# Patient Record
Sex: Male | Born: 1960 | Race: White | Hispanic: No | Marital: Married | Smoking: Never smoker
Health system: Southern US, Community
[De-identification: ages and names within clinical notes are randomized; demographics above are authoritative.]

## PROBLEM LIST (undated history)

## (undated) DIAGNOSIS — H15009 Unspecified scleritis, unspecified eye: Secondary | ICD-10-CM

## (undated) DIAGNOSIS — J309 Allergic rhinitis, unspecified: Secondary | ICD-10-CM

## (undated) DIAGNOSIS — I10 Essential (primary) hypertension: Secondary | ICD-10-CM

## (undated) DIAGNOSIS — K219 Gastro-esophageal reflux disease without esophagitis: Secondary | ICD-10-CM

## (undated) DIAGNOSIS — E669 Obesity, unspecified: Secondary | ICD-10-CM

## (undated) DIAGNOSIS — E1165 Type 2 diabetes mellitus with hyperglycemia: Secondary | ICD-10-CM

## (undated) DIAGNOSIS — J329 Chronic sinusitis, unspecified: Secondary | ICD-10-CM

## (undated) HISTORY — DX: Allergic rhinitis, unspecified: J30.9

## (undated) HISTORY — DX: Type 2 diabetes mellitus with hyperglycemia: E11.65

## (undated) HISTORY — DX: Unspecified scleritis, unspecified eye: H15.009

## (undated) HISTORY — DX: Obesity, unspecified: E66.9

## (undated) HISTORY — DX: Essential (primary) hypertension: I10

## (undated) HISTORY — DX: Gastro-esophageal reflux disease without esophagitis: K21.9

## (undated) HISTORY — DX: Chronic sinusitis, unspecified: J32.9

---

## 1990-10-02 HISTORY — PX: OTHER SURGICAL HISTORY: SHX169

## 1999-03-28 ENCOUNTER — Encounter: Payer: Self-pay | Admitting: Gastroenterology

## 1999-04-01 ENCOUNTER — Encounter: Payer: Self-pay | Admitting: Gastroenterology

## 2001-11-02 DIAGNOSIS — H15009 Unspecified scleritis, unspecified eye: Secondary | ICD-10-CM

## 2001-11-02 HISTORY — DX: Unspecified scleritis, unspecified eye: H15.009

## 2005-08-11 ENCOUNTER — Ambulatory Visit: Payer: Self-pay | Admitting: Family Medicine

## 2005-09-01 ENCOUNTER — Ambulatory Visit: Payer: Self-pay | Admitting: Family Medicine

## 2005-09-27 ENCOUNTER — Ambulatory Visit: Payer: Self-pay | Admitting: Family Medicine

## 2005-10-02 HISTORY — PX: OTHER SURGICAL HISTORY: SHX169

## 2005-12-15 ENCOUNTER — Ambulatory Visit: Payer: Self-pay | Admitting: Family Medicine

## 2006-02-12 ENCOUNTER — Ambulatory Visit: Payer: Self-pay | Admitting: Family Medicine

## 2006-02-16 ENCOUNTER — Ambulatory Visit: Payer: Self-pay | Admitting: Family Medicine

## 2006-07-27 ENCOUNTER — Ambulatory Visit: Payer: Self-pay | Admitting: Family Medicine

## 2006-10-12 ENCOUNTER — Ambulatory Visit: Payer: Self-pay | Admitting: Family Medicine

## 2006-12-05 ENCOUNTER — Ambulatory Visit: Payer: Self-pay | Admitting: Family Medicine

## 2007-02-15 ENCOUNTER — Ambulatory Visit: Payer: Self-pay | Admitting: Family Medicine

## 2007-02-15 LAB — CONVERTED CEMR LAB
Albumin: 4 g/dL (ref 3.5–5.2)
Alkaline Phosphatase: 75 units/L (ref 39–117)
BUN: 14 mg/dL (ref 6–23)
Creatinine, Ser: 1.1 mg/dL (ref 0.4–1.5)
Direct LDL: 155.2 mg/dL
GFR calc non Af Amer: 77 mL/min
HDL: 41 mg/dL (ref >39.0)
Potassium: 4.3 meq/L (ref 3.5–5.1)
Sodium: 140 meq/L (ref 135–145)
TSH: 0.81 microintl units/mL (ref 0.35–5.50)
Total Bilirubin: 0.8 mg/dL (ref 0.3–1.2)
Total CHOL/HDL Ratio: 5
VLDL: 14 mg/dL (ref 0–40)

## 2007-02-20 DIAGNOSIS — I10 Essential (primary) hypertension: Secondary | ICD-10-CM

## 2007-02-20 DIAGNOSIS — E785 Hyperlipidemia, unspecified: Secondary | ICD-10-CM

## 2007-02-22 ENCOUNTER — Ambulatory Visit: Payer: Self-pay | Admitting: Family Medicine

## 2007-04-23 ENCOUNTER — Telehealth (INDEPENDENT_AMBULATORY_CARE_PROVIDER_SITE_OTHER): Payer: Self-pay | Admitting: *Deleted

## 2007-04-29 ENCOUNTER — Ambulatory Visit: Payer: Self-pay | Admitting: Family Medicine

## 2007-06-10 ENCOUNTER — Ambulatory Visit: Payer: Self-pay | Admitting: Family Medicine

## 2007-08-02 ENCOUNTER — Telehealth (INDEPENDENT_AMBULATORY_CARE_PROVIDER_SITE_OTHER): Payer: Self-pay | Admitting: *Deleted

## 2007-08-06 ENCOUNTER — Ambulatory Visit: Payer: Self-pay | Admitting: Family Medicine

## 2007-08-06 DIAGNOSIS — I495 Sick sinus syndrome: Secondary | ICD-10-CM

## 2007-10-03 HISTORY — PX: ESOPHAGOGASTRODUODENOSCOPY: SHX1529

## 2007-10-07 ENCOUNTER — Ambulatory Visit: Payer: Self-pay | Admitting: Family Medicine

## 2008-02-17 ENCOUNTER — Telehealth: Payer: Self-pay | Admitting: Family Medicine

## 2008-02-25 ENCOUNTER — Ambulatory Visit: Payer: Self-pay | Admitting: Family Medicine

## 2008-02-25 LAB — CONVERTED CEMR LAB
Albumin: 3.9 g/dL (ref 3.5–5.2)
Alkaline Phosphatase: 64 units/L (ref 39–117)
BUN: 13 mg/dL (ref 6–23)
Calcium: 9.8 mg/dL (ref 8.4–10.5)
Cholesterol: 228 mg/dL (ref 0–200)
Creatinine, Ser: 1 mg/dL (ref 0.4–1.5)
Direct LDL: 165.9 mg/dL
GFR calc Af Amer: 103 mL/min
GFR calc non Af Amer: 86 mL/min
Glucose, Bld: 112 mg/dL — ABNORMAL HIGH (ref 70–99)
HDL: 33 mg/dL — ABNORMAL LOW (ref >39.0)
Potassium: 4.2 meq/L (ref 3.5–5.1)
Total Protein: 6.7 g/dL (ref 6.0–8.3)
Triglycerides: 161 mg/dL — ABNORMAL HIGH (ref 0–149)
VLDL: 32 mg/dL (ref 0–40)

## 2008-02-27 ENCOUNTER — Ambulatory Visit: Payer: Self-pay | Admitting: Family Medicine

## 2008-02-27 DIAGNOSIS — R5383 Other fatigue: Secondary | ICD-10-CM

## 2008-02-27 DIAGNOSIS — IMO0001 Reserved for inherently not codable concepts without codable children: Secondary | ICD-10-CM

## 2008-02-27 DIAGNOSIS — R5381 Other malaise: Secondary | ICD-10-CM

## 2008-02-28 ENCOUNTER — Encounter: Payer: Self-pay | Admitting: Family Medicine

## 2008-03-01 LAB — CONVERTED CEMR LAB
Basophils Absolute: 0 10*3/uL (ref 0.0–0.1)
Basophils Relative: 0.2 % (ref 0.0–1.0)
Eosinophils Absolute: 0.1 10*3/uL (ref 0.0–0.7)
Eosinophils Relative: 1.4 % (ref 0.0–5.0)
Lymphocytes Relative: 42.5 % (ref 12.0–46.0)
MCV: 87.9 fL (ref 78.0–100.0)
Neutrophils Relative %: 46.4 % (ref 43.0–77.0)
PSA: 0.41 ng/mL (ref 0.10–4.00)
Platelets: 261 10*3/uL (ref 150–400)
RBC: 5.04 M/uL (ref 4.22–5.81)
Rhuematoid fact SerPl-aCnc: <20 intl units/mL — ABNORMAL LOW (ref 0.0–20.0)
Testosterone: 564.63 ng/dL (ref 350.00–890)
WBC: 5.2 10*3/uL (ref 4.5–10.5)

## 2008-03-03 ENCOUNTER — Ambulatory Visit: Payer: Self-pay | Admitting: Family Medicine

## 2008-04-10 ENCOUNTER — Ambulatory Visit: Payer: Self-pay | Admitting: Family Medicine

## 2008-04-13 ENCOUNTER — Encounter (INDEPENDENT_AMBULATORY_CARE_PROVIDER_SITE_OTHER): Payer: Self-pay | Admitting: *Deleted

## 2008-05-14 ENCOUNTER — Ambulatory Visit: Payer: Self-pay | Admitting: Family Medicine

## 2008-06-03 ENCOUNTER — Ambulatory Visit: Payer: Self-pay | Admitting: Family Medicine

## 2008-07-31 ENCOUNTER — Telehealth: Payer: Self-pay | Admitting: Family Medicine

## 2008-08-20 ENCOUNTER — Telehealth (INDEPENDENT_AMBULATORY_CARE_PROVIDER_SITE_OTHER): Payer: Self-pay | Admitting: *Deleted

## 2008-08-25 ENCOUNTER — Ambulatory Visit: Payer: Self-pay | Admitting: Gastroenterology

## 2008-08-26 LAB — CONVERTED CEMR LAB
ALT: 40 units/L (ref 0–53)
AST: 31 units/L (ref 0–37)
Albumin: 3.9 g/dL (ref 3.5–5.2)
Basophils Absolute: 0 10*3/uL (ref 0.0–0.1)
CO2: 29 meq/L (ref 19–32)
Calcium: 9.3 mg/dL (ref 8.4–10.5)
Chloride: 105 meq/L (ref 96–112)
Eosinophils Absolute: 0.1 10*3/uL (ref 0.0–0.7)
Lymphocytes Relative: 43.3 % (ref 12.0–46.0)
MCHC: 34.5 g/dL (ref 30.0–36.0)
MCV: 88.4 fL (ref 78.0–100.0)
Neutrophils Relative %: 42.6 % — ABNORMAL LOW (ref 43.0–77.0)
Platelets: 235 10*3/uL (ref 150–400)
Potassium: 3.7 meq/L (ref 3.5–5.1)
Total Protein: 6.9 g/dL (ref 6.0–8.3)
WBC: 5.7 10*3/uL (ref 4.5–10.5)

## 2008-09-14 ENCOUNTER — Ambulatory Visit: Payer: Self-pay | Admitting: Gastroenterology

## 2008-09-14 ENCOUNTER — Encounter: Payer: Self-pay | Admitting: Gastroenterology

## 2008-09-14 DIAGNOSIS — B3781 Candidal esophagitis: Secondary | ICD-10-CM | POA: Insufficient documentation

## 2008-09-17 ENCOUNTER — Encounter: Payer: Self-pay | Admitting: Gastroenterology

## 2008-10-12 ENCOUNTER — Telehealth: Payer: Self-pay | Admitting: Gastroenterology

## 2008-10-14 ENCOUNTER — Ambulatory Visit: Payer: Self-pay | Admitting: Gastroenterology

## 2009-01-25 ENCOUNTER — Ambulatory Visit: Payer: Self-pay | Admitting: Family Medicine

## 2009-03-16 ENCOUNTER — Ambulatory Visit: Payer: Self-pay | Admitting: Family Medicine

## 2009-10-14 ENCOUNTER — Ambulatory Visit: Payer: Self-pay | Admitting: Family Medicine

## 2009-10-14 ENCOUNTER — Encounter: Payer: Self-pay | Admitting: Family Medicine

## 2009-12-14 ENCOUNTER — Ambulatory Visit: Payer: Self-pay | Admitting: Family Medicine

## 2010-02-04 ENCOUNTER — Telehealth: Payer: Self-pay | Admitting: Family Medicine

## 2010-02-05 ENCOUNTER — Ambulatory Visit: Payer: Self-pay | Admitting: Family Medicine

## 2010-03-01 ENCOUNTER — Ambulatory Visit: Payer: Self-pay | Admitting: Family Medicine

## 2010-05-09 ENCOUNTER — Encounter (INDEPENDENT_AMBULATORY_CARE_PROVIDER_SITE_OTHER): Payer: Self-pay | Admitting: *Deleted

## 2010-05-10 ENCOUNTER — Telehealth: Payer: Self-pay | Admitting: Family Medicine

## 2010-05-14 ENCOUNTER — Ambulatory Visit: Payer: Self-pay | Admitting: Family Medicine

## 2010-05-14 DIAGNOSIS — M79609 Pain in unspecified limb: Secondary | ICD-10-CM

## 2010-06-28 ENCOUNTER — Ambulatory Visit: Payer: Self-pay | Admitting: Internal Medicine

## 2010-07-12 ENCOUNTER — Telehealth (INDEPENDENT_AMBULATORY_CARE_PROVIDER_SITE_OTHER): Payer: Self-pay | Admitting: *Deleted

## 2010-07-13 ENCOUNTER — Ambulatory Visit: Payer: Self-pay | Admitting: Family Medicine

## 2010-07-20 ENCOUNTER — Ambulatory Visit: Payer: Self-pay | Admitting: Family Medicine

## 2010-09-08 ENCOUNTER — Ambulatory Visit: Payer: Self-pay | Admitting: Internal Medicine

## 2010-11-01 NOTE — Assessment & Plan Note (Signed)
Summary: sinus infection/ alc   Vital Signs:  Patient profile:   50 year old male Weight:      278.75 pounds Temp:     98.5 degrees F oral Pulse rate:   60 / minute Pulse rhythm:   regular BP sitting:   124 / 86  (left arm) Cuff size:   large  Vitals Entered By: Sydell Axon LPN (December 14, 2009 10:08 AM) CC: ? Sinus infection, headache, sinus drainage, ringing in the ears and follow-up on pain in toes   History of Present Illness: Pt here for followup of toe pain. He noticed in January that he was having areas on his toes that hurt and focally looked to hiim like he had fluid in them. The lesions are painful but mostly flat.  Craqmping has improved. He has no fever or chills, he has headache frontally off and on at no particular time, no ear pain now but had some previously. He has chronch ringing. He flew duroing the time of the sxs starting and then got a cold in [place] with a cold. He has nasal congestion, using Guaifenesin, mild rhinitis but a lot of PND, has ST, and has cough, sometimmes excessive at night and keeping him awake. He has no SOB, no N/V now but has had nausea today and has ongoing difficulties there, no GI help through referral.  Problems Prior to Update: 1)  Leg Cramps  (ICD-729.82) 2)  Overweight  (ICD-278.02) 3)  Candidiasis of The Esophagus  (ICD-112.84) 4)  Nausea  (ICD-787.02) 5)  Muscle Pain  (ICD-729.1) 6)  Fatigue  (ICD-780.79) 7)  Sinus Bradycardia  (ICD-427.81) 8)  Health Maintenance Exam  (ICD-V70.0) 9)  Gerd, Nsaid Induced, No Nsaids  (ICD-530.81) 10)  Hypercholesterolemia, Pure  (ICD-272.0) 11)  Hypertension, Benign Essential  (ICD-401.1)  Medications Prior to Update: 1)  Lisinopril 20 Mg  Tabs (Lisinopril) .... One Tab By Mouth At Night 2)  Astelin 137 Mcg/spray Soln (Azelastine Hcl) .... As Directed 3)  Nasonex 50 Mcg/act Susp (Mometasone Furoate) .... As Directed As Needed 4)  Zyrtec Allergy 10 Mg Tabs (Cetirizine Hcl) .Marland Kitchen.. 1 Daily As Needed For  Allergies 5)  Prevacid 30 Mg Cpdr (Lansoprazole) .... Take 1 Tablet By Mouth Once A Day  Allergies: 1)  ! Nsaids 2)  Zithromax (Azithromycin) 3)  Ketek (Telithromycin)  Physical Exam  General:  Well-developed,well-nourished,in no acute distress; alert,appropriate and cooperative throughout examination, mildly obese, minimally congested and nontoxic.Marland Kitchen Head:  Normocephalic and atraumatic without obvious abnormalities. No apparent alopecia, mild balding.balding. Suinuses minimally tender in frontal distrib. Eyes:  Conjunctiva clear bilaterally.  Ears:  External ear exam shows no significant lesions or deformities.  Otoscopic examination reveals clear canals, tympanic membranes are intact bilaterally without bulging, retraction, inflammation or discharge. Hearing is grossly normal bilaterally. Nose:  External nasal examination shows no deformity or inflammation. Nasal mucosa are pink and moidst, mildly inflamed R>L. Mouth:  Oral mucosa and oropharynx without lesions or exudates.  Teeth in good repair. No mucocele noted, tonsils with crypts w/o debris. Mild thick PND. Neck:  No deformities, masses, or tenderness noted. Lungs:  Normal respiratory effort, chest expands symmetrically. Lungs are clear to auscultation, no crackles or wheezes. Heart:  Normal rate and regular rhythm. S1 and S2 normal without gallop, murmur, click, rub or other extra sounds. Abdomen:  Bowel sounds positive,abdomen soft and non-tender without masses, organomegaly or hernias noted.   Impression & Recommendations:  Problem # 1:  DERMATITIS OF TOES FROM DRYNESS AND  ABRASION (ICD-692.9) Assessment New See instructions. His updated medication list for this problem includes:    Zyrtec Allergy 10 Mg Tabs (Cetirizine hcl) .Marland Kitchen... 1 daily as needed for allergies  Problem # 2:  URI (ICD-465.9) Assessment: New See instrucrttions. Start Amox Sat if no better. His updated medication list for this problem includes:    Zyrtec  Allergy 10 Mg Tabs (Cetirizine hcl) .Marland Kitchen... 1 daily as needed for allergies    Tussionex Pennkinetic Er 8-10 Mg/51ml Lqcr (Chlorpheniramine-hydrocodone) ..... One tsp by mouth at night.  Problem # 3:  LEG CRAMPS (ICD-729.82) Assessment: Improved Discussed mustard if sxs recurred.  Complete Medication List: 1)  Lisinopril 20 Mg Tabs (Lisinopril) .... One tab by mouth at night 2)  Astelin 137 Mcg/spray Soln (Azelastine hcl) .... Use one spray in each nostril every am 3)  Nasonex 50 Mcg/act Susp (Mometasone furoate) .... Use one spray in each nostril every am 4)  Zyrtec Allergy 10 Mg Tabs (Cetirizine hcl) .Marland Kitchen.. 1 daily as needed for allergies 5)  Prevacid 30 Mg Cpdr (Lansoprazole) .... Take 1 tablet by mouth once a day 6)  Tussionex Pennkinetic Er 8-10 Mg/41ml Lqcr (Chlorpheniramine-hydrocodone) .... One tsp by mouth at night. 7)  Amoxicillin 500 Mg Caps (Amoxicillin) .... 2 tabs by mouth two times a day  Patient Instructions: 1)  Wear shoes and use Eucerin on feet two times a day.  2)  Use Tussionex at night. 3)  Take Guaifenesin by [facility] or [facility] and getting MUCOUS RELIEF EXPECTORANT (400mg ), take 11/2 tabs by mouth AM and NOON. 4)  Drink lots of fluids anytime taking Guaifenesin.  5)  Take Tyl ES 2 tabs by mouth three times a day. 6)  Keep lozenge in mouth all the time. 7)  Gargle with warm salt water every 1/2 hr for 2-3 days. 8)  Neti Pot as dir. Prescriptions: AMOXICILLIN 500 MG CAPS (AMOXICILLIN) 2 tabs by mouth two times a day  #40 x 0   Entered and Authorized by:   Shaune Leeks MD   Signed by:   Shaune Leeks MD on 12/14/2009   Method used:   Print then Give to Patient   RxID:   [id number] TUSSIONEX PENNKINETIC ER 8-10 MG/5ML LQCR (CHLORPHENIRAMINE-HYDROCODONE) one tsp by mouth at night.  #8 oz x 0   Entered and Authorized by:   Shaune Leeks MD   Signed by:   Shaune Leeks MD on 12/14/2009   Method used:   Print then  Give to Patient   RxID:   [id number]   Current Allergies (reviewed today): ! NSAIDS ZITHROMAX (AZITHROMYCIN) KETEK (TELITHROMYCIN)  Prevention & Chronic Care Immunizations   Influenza vaccine: Not documented    Tetanus booster: 10/03/2002: Td    Pneumococcal vaccine: Not documented  Other Screening   Smoking status: never  (02/20/2007)  Lipids   Total Cholesterol: 228  (02/25/2008)   LDL: DEL  (02/25/2008)   LDL Direct: 165.9  (02/25/2008)   HDL: 33.0  (02/25/2008)   Triglycerides: 161  (02/25/2008)    SGOT (AST): 31  (08/25/2008)   SGPT (ALT): 40  (08/25/2008)   Alkaline phosphatase: 81  (08/25/2008)   Total bilirubin: 0.4  (08/25/2008)  Hypertension   Last Blood Pressure: 124 / 86  (12/14/2009)   Serum creatinine: 1.0  (08/25/2008)   Serum potassium 3.7  (08/25/2008)  Self-Management Support :    Hypertension self-management support: Not documented    Lipid self-management support: Not documented

## 2010-11-01 NOTE — Letter (Signed)
Summary: Nadara Eaton letter  [place] [facility]  [address] [place], [place] [address]   Phone: [phone]  Fax: [phone]       05/09/2010 MRN: [id number]  [facility] [address] [place], [place]  [address]  Dear Mr. Danelle Earth[facility] [place], and [place] announce the retirement of Arta Silence, M.D., from full-time practice at the [facility] office effective March 31, 2010 and his plans of returning part-time.  It is important to Dr. Hetty Ely and to our practice that you understand that [facility] has seven physicians in our office for your health care needs.  We will continue to offer the same exceptional care that you have today.    Dr. Hetty Ely has spoken to many of you about his plans for retirement and returning part-time in the fall.   We will continue to work with you through the transition to schedule appointments for you in the office and meet the high standards that [place] is committed to.   Again, it is with great pleasure that we share the news that Dr. Hetty Ely will return to [facility] in October of 2011 with a reduced schedule.    If you have any questions, or would like to request an appointment with one of our physicians, please call us at [phone] and press the option for Scheduling an appointment.  We take pleasure in providing you with excellent patient care and look forward to seeing you at your next office visit.  Our [facility] Physicians are:  Tillman Abide, M.D. Laurita Quint, M.D. Roxy Manns, M.D. Kerby Nora, M.D. Hannah Beat, M.D. Ruthe Mannan, M.D. We proudly welcomed Raechel Ache, M.D. and Eustaquio Boyden, M.D. to the practice in July/August 2011.  Sincerely,  [facility]

## 2010-11-01 NOTE — Assessment & Plan Note (Signed)
Summary: ?SINUS INFECTION/CLE   Vital Signs:  Patient profile:   50 year old male Weight:      278.50 pounds Temp:     98.4 degrees F oral Pulse rate:   64 / minute Pulse rhythm:   regular BP sitting:   130 / 80  (left arm) Cuff size:   large  Vitals Entered By: Selena Batten Dance CMA Duncan Dull) (June 28, 2010 8:09 AM) CC: ? sinus infection   History of Present Illness: CC: sinus infx?  1+wk h/o tenderness in teeth, headache.  Started guaifenesin two times a day. Over weekend had bad drainage down throat.  Mainly left frontal and max sinus pain as well as ear pain and tooth pain.  No fevers/chills.  + coughing, mild.    + daughter sick at home.  No smokers at home.  Pt with h/o sinus infections in past, had orbital cellulitis s/p OR drainage x 2.  Normally takes astelin as well as nasonex and zyrtec.  Drinking plenty of fluids.  Takes amox and augmentin for sinus infections.  Current Medications (verified): 1)  Amlodipine Besylate 5 Mg Tabs (Amlodipine Besylate) .... One Tab By Mouth At Night 2)  Lisinopril 20 Mg  Tabs (Lisinopril) .... One Tab By Mouth At Night 3)  Astelin 137 Mcg/spray Soln (Azelastine Hcl) .... Use One Spray in Each Nostril Every Am 4)  Nasonex 50 Mcg/act Susp (Mometasone Furoate) .... Use One Spray in Each Nostril Every Am 5)  Zyrtec Allergy 10 Mg Tabs (Cetirizine Hcl) .Marland Kitchen.. 1 Daily As Needed For Allergies 6)  Prilosec 20 Mg Cpdr (Omeprazole) .Marland Kitchen.. 1 Every Am  Allergies: 1)  ! Nsaids 2)  Zithromax (Azithromycin) 3)  Ketek (Telithromycin)  Past History:  Past Medical History: GERD Hypertension obesity h/o sinus infections acute on chronic, h/o orbital cellulitis complicating sinusitis in past  Past Surgical History: Reviewed history from 09/18/2008 and no changes required. 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 Claris Gower) R/O''d presume  secondary nsaids 12/04/05  EGD Mod Gastritis Esophagitis Candida by Cult (Dr Christella Hartigan) 09/14/08  Social History: Reviewed history from 08/25/2008 and no changes required. Marital Status: Married Children: One daughter Occupation: Production manager for [employer] nonsmoker, nondrinker, drinks one caffeinated beverage a day currently  Review of Systems       per HPI  Physical Exam  General:  overweight but generally well appearing  Head:  normocephalic, atraumatic, and no abnormalities observed.  L maxillary >frontal sinus tenderness Eyes:  vision grossly intact, pupils equal, pupils round, and pupils reactive to light.  no conjunctival pallor, injection or icterus, EOMI without pain Ears:  R ear normal and L ear normal.   Nose:  nares are injected and congested bilaterally  Mouth:  pharynx pink and moist, no erythema, and no exudates.   Neck:  supple with full rom and no masses or thyromegally, no JVD or carotid bruit  Lungs:  Normal respiratory effort, chest expands symmetrically. Lungs are clear to auscultation, no crackles or wheezes. Heart:  Normal rate and regular rhythm. S1 and S2 normal without gallop, murmur, click, rub or other extra sounds. Pulses:  2+ rad pulses Extremities:  no edema   Impression & Recommendations:  Problem # 1:  SINUSITIS - ACUTE-NOS (ICD-461.9) Continue current sinus/allergy treatment.  add neti pot/nasal saline and 2wk course abx.  Instructed on treatment. Advised to call if not improving as expected or still feeling  poorly after 2 wk course abx, may need extension given h/o chronic sinusitis.  If not improving, consider referral to ENT.  Call if symptoms persist or worsen.  Treating aggressively with abx early given h/o orbital celluitis in past as sinusitis complication  The following medications were removed from the medication list:    Augmentin 875-125 Mg Tabs (Amoxicillin-pot clavulanate) .Marland Kitchen... 1 by mouth two times a day for 10  days His updated medication list for this problem includes:    Astelin 137 Mcg/spray Soln (Azelastine hcl) ..... Use one spray in each nostril every am    Nasonex 50 Mcg/act Susp (Mometasone furoate) ..... Use one spray in each nostril every am    Augmentin 875-125 Mg Tabs (Amoxicillin-pot clavulanate) ..... One by mouth two times a day x 14 days  Complete Medication List: 1)  Amlodipine Besylate 5 Mg Tabs (Amlodipine besylate) .... One tab by mouth at night 2)  Lisinopril 20 Mg Tabs (Lisinopril) .... One tab by mouth at night 3)  Astelin 137 Mcg/spray Soln (Azelastine hcl) .... Use one spray in each nostril every am 4)  Nasonex 50 Mcg/act Susp (Mometasone furoate) .... Use one spray in each nostril every am 5)  Zyrtec Allergy 10 Mg Tabs (Cetirizine hcl) .Marland Kitchen.. 1 daily as needed for allergies 6)  Prilosec 20 Mg Cpdr (Omeprazole) .Marland Kitchen.. 1 every am 7)  Augmentin 875-125 Mg Tabs (Amoxicillin-pot clavulanate) .... One by mouth two times a day x 14 days  Patient Instructions: 1)  Possible early sinusitis again.  Treat with 2 wk course antibiotics. 2)  Continue current sinus treatment, consider starting neti pot of nasal saline spray. 3)  Continue plenty of fluids and rest to help you get over this quicker. 4)  Pleasure to see you today, call clinic iwth questions. 5)  Let us know if not improving as expected. Prescriptions: AUGMENTIN 875-125 MG TABS (AMOXICILLIN-POT CLAVULANATE) one by mouth two times a day x 14 days  #28 x 0   Entered and Authorized by:   Eustaquio Boyden  MD   Signed by:   Eustaquio Boyden  MD on 06/28/2010   Method used:   [facility] to        [facility] [phone]* (retail)       [address] [place], [place]  [address]       Ph: [phone] or [phone]       Fax: [phone]   RxID:   [id number]   Current Allergies (reviewed today): ! NSAIDS ZITHROMAX (AZITHROMYCIN) KETEK (TELITHROMYCIN)

## 2010-11-01 NOTE — Assessment & Plan Note (Signed)
Summary: PAIN IN RIGHT LEG/RBH   Vital Signs:  Patient profile:   50 year old male Height:      70 inches Weight:      274.25 pounds BMI:     39.49 Temp:     97.8 degrees F oral Pulse rate:   92 / minute Pulse rhythm:   regular BP sitting:   156 / 88  (left arm) Cuff size:   large  Vitals Entered By: Delilah Shan CMA Duncan Dull) (October 14, 2009 3:39 PM) CC: Pain in right leg   History of Present Illness: 50 yo with pain in right leg. Started yesterday, full leg cramp, felt it was worse around his knee. Has now resolved but he was very concerend because it woke him from sleep and lasted for hours. Nothing made it better or worse. No redness or swelling around his knee. Never had anything like this before.  HTN- BP elevated today.  Of note, his mother died a few weeks ago, under a lot of stress. No CP, SOB, or blurred vision.  Current Medications (verified): 1)  Lisinopril 20 Mg  Tabs (Lisinopril) .... One Tab By Mouth At Night 2)  Astelin 137 Mcg/spray Soln (Azelastine Hcl) .... As Directed 3)  Nasonex 50 Mcg/act Susp (Mometasone Furoate) .... As Directed As Needed 4)  Zyrtec Allergy 10 Mg Tabs (Cetirizine Hcl) .Marland Kitchen.. 1 Daily As Needed For Allergies 5)  Prevacid 30 Mg Cpdr (Lansoprazole) .... Take 1 Tablet By Mouth Once A Day  Allergies: 1)  ! Nsaids 2)  Zithromax (Azithromycin) 3)  Ketek (Telithromycin)  Review of Systems      See HPI MS:  Complains of cramps; denies joint pain, joint redness, joint swelling, and muscle weakness.  Physical Exam  General:  Well-developed,well-nourished,in no acute distress; alert,appropriate and cooperative throughout examination, mildly obese. Msk:  Right leg- normal inspection, normal palpation, no joint tenderness, no joint swelling, no joint warmth, no redness over joints, and no crepitation.  no joint tenderness, no joint swelling, no joint warmth, no redness over joints, and no crepitation.   Psych:  Cognition and judgment appear  intact. Alert and cooperative with normal attention span and concentration. No apparent delusions, illusions, hallucinations   Impression & Recommendations:  Problem # 1:  LEG CRAMPS (ICD-729.82) Assessment New Resolved.  Etiology unknown but likely multifactorial.  Under a lot of stress.  Will check electrolytes, CK, TSH, CBC to rule out reversible causes. Orders: Venipuncture (45409) Specimen Handling (81191) TLB-BMP (Basic Metabolic Panel-BMET) (80048-METABOL) TLB-CBC Platelet - w/Differential (85025-CBCD) TLB-TSH (Thyroid Stimulating Hormone) (84443-TSH) TLB-CK Total Only(Creatine Kinase/CPK) (82550-CK)  Problem # 2:  HYPERTENSION, BENIGN ESSENTIAL (ICD-401.1) Assessment: Deteriorated  Likely due to life stressors.  He has BP cuff at home, advised to keep record and bring to next appt. His updated medication list for this problem includes:    Lisinopril 20 Mg Tabs (Lisinopril) ..... One tab by mouth at night  His updated medication list for this problem includes:    Lisinopril 20 Mg Tabs (Lisinopril) ..... One tab by mouth at night  Complete Medication List: 1)  Lisinopril 20 Mg Tabs (Lisinopril) .... One tab by mouth at night 2)  Astelin 137 Mcg/spray Soln (Azelastine hcl) .... As directed 3)  Nasonex 50 Mcg/act Susp (Mometasone furoate) .... As directed as needed 4)  Zyrtec Allergy 10 Mg Tabs (Cetirizine hcl) .Marland Kitchen.. 1 daily as needed for allergies 5)  Prevacid 30 Mg Cpdr (Lansoprazole) .... Take 1 tablet by mouth once a day Prescriptions:  ASTELIN 137 MCG/SPRAY SOLN (AZELASTINE HCL) as directed  #1 x 6   Entered by:   Delilah Shan CMA (AAMA)   Authorized by:   Ruthe Mannan MD   Signed by:   Delilah Shan CMA (AAMA) on 10/15/2009   Method used:   [facility] to        [facility] [phone]* (retail)       [address] [place], [place]  [address]       Ph: [phone] or [phone]       Fax: [phone]   RxID:   [id number] NASONEX 50  MCG/ACT SUSP (MOMETASONE FUROATE) as directed as needed  #17 Gram x 11   Entered by:   Delilah Shan CMA (AAMA)   Authorized by:   Ruthe Mannan MD   Signed by:   Delilah Shan CMA (AAMA) on 10/14/2009   Method used:   Electronically to        [facility] [phone]* (retail)       [address] [place], [place]  [address]       Ph: [phone] or [phone]       Fax: [phone]   RxID:   [id number] ASTELIN 137 MCG/SPRAY SOLN (AZELASTINE HCL) as directed  #0 x 6   Entered by:   Delilah Shan CMA (AAMA)   Authorized by:   Ruthe Mannan MD   Signed by:   Delilah Shan CMA (AAMA) on 10/14/2009   Method used:   [facility]ron[facility] to        [facility] [phone]* (retail)       [address] [place], [place]  [address]       Ph: [phone] or [phone]       Fax: [phone]   RxID:   [id number]    Orders Added: 1)  Venipuncture [25053] 2)  Specimen Handling [99000] 3)  TLB-BMP (Basic Metabolic Panel-BMET) [80048-METABOL] 4)  TLB-CBC Platelet - w/Differential [85025-CBCD] 5)  TLB-TSH (Thyroid Stimulating Hormone) [84443-TSH] 6)  TLB-CK Total Only(Creatine Kinase/CPK) [82550-CK] 7)  Est. Patient Level III [[address]]   Current Allergies (reviewed today): ! NSAIDS ZITHROMAX (AZITHROMYCIN) KETEK (TELITHROMYCIN)

## 2010-11-01 NOTE — Assessment & Plan Note (Signed)
Summary: follow up on meds   Vital Signs:  Patient profile:   50 year old male Weight:      279.75 pounds Temp:     98.6 degrees F oral Pulse rate:   60 / minute Pulse rhythm:   regular BP sitting:   120 / 76  (left arm) Cuff size:   large  Vitals Entered By: Sydell Axon LPN (Mar 01, 2010 8:55 AM) CC: follow-up on meds and BP   History of Present Illness: Pt here with wife. He has gained weight since last time and is now on Amlodipine for BP which he is tolerating well. Has mionimal dizziness. No real complaints today. He is still doing most if not all of the cooking.  Allergies: 1)  ! Nsaids 2)  Zithromax (Azithromycin) 3)  Ketek (Telithromycin)  Physical Exam  General:  Well-developed,well-nourished,in no acute distress; alert,appropriate and cooperative throughout examination,  obese. Head:  Normocephalic and atraumatic without obvious abnormalities. No apparent alopecia, mild balding.balding. Suinuses NT. Eyes:  Conjunctiva clear bilaterally.  Ears:  External ear exam shows no significant lesions or deformities.  Otoscopic examination reveals clear canals, tympanic membranes are intact bilaterally without bulging, retraction, inflammation or discharge. Hearing is grossly normal bilaterally. Nose:  External nasal examination shows no deformity or inflammation. Nasal mucosa are pink and moist. Mouth:  Oral mucosa and oropharynx without lesions or exudates.  Teeth in good repair. No mucocele noted, tonsils with crypts w/o debris.  Neck:  No deformities, masses, or tenderness noted. Chest Wall:  No deformities, masses, tenderness or gynecomastia noted. Lungs:  Normal respiratory effort, chest expands symmetrically. Lungs are clear to auscultation, no crackles or wheezes. Heart:  Normal rate and regular rhythm. S1 and S2 normal without gallop, murmur, click, rub or other extra sounds.   Impression & Recommendations:  Problem # 1:  HYPERTENSION, BENIGN ESSENTIAL  (ICD-401.1) Assessment Improved Nos better with BP but weight worse...Marland Kitchenneeds to lose and get ojn good regimen for eating, not reward! His updated medication list for this problem includes:    Lisinopril 20 Mg Tabs (Lisinopril) ..... One tab by mouth at night    Amlodipine Besylate 5 Mg Tabs (Amlodipine besylate) ..... One tab by mouth at night  BP today: 120/76 Prior BP: 148/90 (02/05/2010)  Labs Reviewed: K+: 3.7 (08/25/2008) Creat: : 1.0 (08/25/2008)   Chol: 228 (02/25/2008)   HDL: 33.0 (02/25/2008)   LDL: DEL (02/25/2008)   TG: 161 (02/25/2008)  Problem # 2:  OVERWEIGHT (ICD-278.02) Assessment: Deteriorated Needs to eat helthy and healthily!  Discussed. Ht: 70 (02/05/2010)   Wt: 279.75 (03/01/2010)   BMI: 39.72 (02/05/2010)  Complete Medication List: 1)  Lisinopril 20 Mg Tabs (Lisinopril) .... One tab by mouth at night 2)  Astelin 137 Mcg/spray Soln (Azelastine hcl) .... Use one spray in each nostril every am 3)  Nasonex 50 Mcg/act Susp (Mometasone furoate) .... Use one spray in each nostril every am 4)  Zyrtec Allergy 10 Mg Tabs (Cetirizine hcl) .Marland Kitchen.. 1 daily as needed for allergies 5)  Prevacid 30 Mg Cpdr (Lansoprazole) .... Take 1 tablet by mouth once a day 6)  Amlodipine Besylate 5 Mg Tabs (Amlodipine besylate) .... One tab by mouth at night  Patient Instructions: 1)  RTC Oct/Nov for Comp Exam, labs prior. Call in  3 weeks.  Current Allergies (reviewed today): ! NSAIDS ZITHROMAX (AZITHROMYCIN) KETEK (TELITHROMYCIN)

## 2010-11-01 NOTE — Assessment & Plan Note (Signed)
Summary: HIGH BLOOD PRESSURE...AS.   Vital Signs:  Patient profile:   50 year old male Height:      70 inches Weight:      275.8 pounds BMI:     39.72 Pulse rate:   68 / minute BP sitting:   148 / 90  Vitals Entered By: Shary Decamp (Feb 05, 2010 11:09 AM) CC: elevated bp.... c/o of lightheaded & dizzy, bp @ home 180/100, 172/92, 150's--170's/90-100   History of Present Illness: Pt here with his daughter for headaches and dizziness with elevated BPs at the same time. He has no other problems and otherwise is doingwell but has some stress at home as his wife just had surgery and he is caring for her as well.   Problems Prior to Update: 1)  Dermatitis of Toes From Dryness and Abrasion  (ICD-692.9) 2)  Uri  (ICD-465.9) 3)  Leg Cramps  (ICD-729.82) 4)  Overweight  (ICD-278.02) 5)  Candidiasis of The Esophagus  (ICD-112.84) 6)  Nausea  (ICD-787.02) 7)  Muscle Pain  (ICD-729.1) 8)  Fatigue  (ICD-780.79) 9)  Sinus Bradycardia  (ICD-427.81) 10)  Health Maintenance Exam  (ICD-V70.0) 11)  Gerd, Nsaid Induced, No Nsaids  (ICD-530.81) 12)  Hypercholesterolemia, Pure  (ICD-272.0) 13)  Hypertension, Benign Essential  (ICD-401.1)  Medications Prior to Update: 1)  Lisinopril 20 Mg  Tabs (Lisinopril) .... One Tab By Mouth At Night 2)  Astelin 137 Mcg/spray Soln (Azelastine Hcl) .... Use One Spray in Each Nostril Every Am 3)  Nasonex 50 Mcg/act Susp (Mometasone Furoate) .... Use One Spray in Each Nostril Every Am 4)  Zyrtec Allergy 10 Mg Tabs (Cetirizine Hcl) .Marland Kitchen.. 1 Daily As Needed For Allergies 5)  Prevacid 30 Mg Cpdr (Lansoprazole) .... Take 1 Tablet By Mouth Once A Day 6)  Tussionex Pennkinetic Er 8-10 Mg/35ml Lqcr (Chlorpheniramine-Hydrocodone) .... One Tsp By Mouth At Night. 7)  Amoxicillin 500 Mg Caps (Amoxicillin) .... 2 Tabs By Mouth Two Times A Day  Current Medications (verified): 1)  Lisinopril 20 Mg  Tabs (Lisinopril) .... One Tab By Mouth At Night 2)  Astelin 137 Mcg/spray Soln  (Azelastine Hcl) .... Use One Spray in Each Nostril Every Am 3)  Nasonex 50 Mcg/act Susp (Mometasone Furoate) .... Use One Spray in Each Nostril Every Am 4)  Zyrtec Allergy 10 Mg Tabs (Cetirizine Hcl) .Marland Kitchen.. 1 Daily As Needed For Allergies 5)  Prevacid 30 Mg Cpdr (Lansoprazole) .... Take 1 Tablet By Mouth Once A Day  Allergies (verified): 1)  ! Nsaids 2)  Zithromax (Azithromycin) 3)  Ketek (Telithromycin)  Physical Exam  General:  Well-developed,well-nourished,in no acute distress; alert,appropriate and cooperative throughout examination, mildly obese, minimally congested and nontoxic.Marland Kitchen Head:  Normocephalic and atraumatic without obvious abnormalities. No apparent alopecia, mild balding.balding. Suinuses minimally tender in frontal distrib. Eyes:  Conjunctiva clear bilaterally.  Ears:  External ear exam shows no significant lesions or deformities.  Otoscopic examination reveals clear canals, tympanic membranes are intact bilaterally without bulging, retraction, inflammation or discharge. Hearing is grossly normal bilaterally. Nose:  External nasal examination shows no deformity or inflammation. Nasal mucosa are pink and moidst, mildly inflamed R>L. Mouth:  Oral mucosa and oropharynx without lesions or exudates.  Teeth in good repair. No mucocele noted, tonsils with crypts w/o debris. Mild thick PND. Neck:  No deformities, masses, or tenderness noted. Lungs:  Normal respiratory effort, chest expands symmetrically. Lungs are clear to auscultation, no crackles or wheezes. Heart:  Normal rate and regular rhythm. S1 and S2  normal without gallop, murmur, click, rub or other extra sounds. Abdomen:  Bowel sounds positive,abdomen soft and non-tender without masses, organomegaly or hernias noted. Psych:  Cognition and judgment appear intact. Alert and cooperative with normal attention span and concentration. No apparent delusions, illusions, hallucinations   Impression & Recommendations:  Problem # 1:   HYPERTENSION, BENIGN ESSENTIAL (ICD-401.1) Assessment Deteriorated Add Amlodipine. Pt has been on Verapamil previously but I switched that as it was decreasing his HR too much.  Will try 5mg  daily. His updated medication list for this problem includes:    Lisinopril 20 Mg Tabs (Lisinopril) ..... One tab by mouth at night    Amlodipine Besylate 5 Mg Tabs (Amlodipine besylate) ..... One tab by mouth at night  BP today: 148/90 Prior BP: 124/86 (12/14/2009)  Labs Reviewed: K+: 3.7 (08/25/2008) Creat: : 1.0 (08/25/2008)   Chol: 228 (02/25/2008)   HDL: 33.0 (02/25/2008)   LDL: DEL (02/25/2008)   TG: 161 (02/25/2008)  Complete Medication List: 1)  Lisinopril 20 Mg Tabs (Lisinopril) .... One tab by mouth at night 2)  Astelin 137 Mcg/spray Soln (Azelastine hcl) .... Use one spray in each nostril every am 3)  Nasonex 50 Mcg/act Susp (Mometasone furoate) .... Use one spray in each nostril every am 4)  Zyrtec Allergy 10 Mg Tabs (Cetirizine hcl) .Marland Kitchen.. 1 daily as needed for allergies 5)  Prevacid 30 Mg Cpdr (Lansoprazole) .... Take 1 tablet by mouth once a day 6)  Amlodipine Besylate 5 Mg Tabs (Amlodipine besylate) .... One tab by mouth at night  Patient Instructions: 1)  Cnx this weeks appt and make one for 3 weeks. Prescriptions: AMLODIPINE BESYLATE 5 MG TABS (AMLODIPINE BESYLATE) one tab by mouth at night  #30 x 12   Entered and Authorized by:   Shaune Leeks MD   Signed by:   Shaune Leeks MD on 02/05/2010   Method used:   Electronically to        [facility] [phone]* (retail)       [address] [place], [place]  [address]       Ph: [phone] or [phone]       Fax: [phone]   RxID:   [id number]

## 2010-11-01 NOTE — Progress Notes (Signed)
----   Converted from flag ---- ---- 07/11/2010 5:30 PM, Crawford Givens MD wrote: cmet/lipid 401.1  ---- 07/11/2010 4:29 PM, Melody Comas wrote: Patient is coming in for cpx labs for Dr. Hetty Ely tomorrow. Dr. Hetty Ely is not in tomorrow. What labs should be drawn and diagnosis please. ------------------------------

## 2010-11-01 NOTE — Progress Notes (Signed)
Summary: sinus sxs  Phone Note Call from Patient   Caller: Patient Summary of Call: Pt called complaining of sinus sxs, asked to be seen.  Offered 2 appt times this afternoon or appt tomorrow.  These times were not convenient for the pt, I suggested he go to [facility]. Initial call taken by: Lowella Petties CMA,  May 10, 2010 8:55 AM

## 2010-11-01 NOTE — Progress Notes (Signed)
Summary: BP elevated  Phone Note Call from Patient   Caller: Patient Summary of Call: Pt reports that his blood pressure has been running high,  180/100 this morning.  Advised pt he needs appt.  Appt scheduled for 5/10.  He will keep a check on BP this week end and will call on monday if it doesnt go down. Initial call taken by: Lowella Petties CMA,  Feb 04, 2010 12:29 PM  Follow-up for Phone Call        Noted and agree. Follow-up by: Shaune Leeks MD,  Feb 04, 2010 3:10 PM

## 2010-11-01 NOTE — Assessment & Plan Note (Signed)
Summary: CPX/DLO   Vital Signs:  Patient profile:   50 year old male Weight:      275 pounds Temp:     98.2 degrees F oral Pulse rate:   64 / minute Pulse rhythm:   regular BP sitting:   120 / 80  (left arm) Cuff size:   large  Vitals Entered By: Sydell Axon LPN (July 20, 2010 9:23 AM) CC: 30 minute checkup   History of Present Illness: Pt here for Comp Exam. He had had sinus infection from seasonal Fall allergy congestion which has now finally gone away. He feels well today without  he has started basic regular exercise lately with somre diet changes lately. This has been a rough year with stress at work. The first version is out and vacation is coming up.   Preventive Screening-Counseling & Management  Alcohol-Tobacco     Alcohol drinks/day: <1     Alcohol type: rare beer     Smoking Status: never     Passive Smoke Exposure: no  Caffeine-Diet-Exercise     Caffeine use/day: 0     Does Patient Exercise: yes     Type of exercise: aerobic exerc and cycles     Times/week: <3  Problems Prior to Update: 1)  Leg Pain, Bilateral  (ICD-729.5) 2)  Sinusitis - Acute-nos  (ICD-461.9) 3)  Dermatitis of Toes From Dryness and Abrasion  (ICD-692.9) 4)  Leg Cramps  (ICD-729.82) 5)  Overweight  (ICD-278.02) 6)  Candidiasis of The Esophagus  (ICD-112.84) 7)  Nausea  (ICD-787.02) 8)  Muscle Pain  (ICD-729.1) 9)  Fatigue  (ICD-780.79) 10)  Sinus Bradycardia  (ICD-427.81) 11)  Health Maintenance Exam  (ICD-V70.0) 12)  Gerd, Nsaid Induced, No Nsaids  (ICD-530.81) 13)  Hypercholesterolemia, Pure  (ICD-272.0) 14)  Hypertension, Benign Essential  (ICD-401.1)  Medications Prior to Update: 1)  Amlodipine Besylate 5 Mg Tabs (Amlodipine Besylate) .... One Tab By Mouth At Night 2)  Lisinopril 20 Mg  Tabs (Lisinopril) .... One Tab By Mouth At Night 3)  Astelin 137 Mcg/spray Soln (Azelastine Hcl) .... Use One Spray in Each Nostril Every Am 4)  Nasonex 50 Mcg/act Susp (Mometasone Furoate)  .... Use One Spray in Each Nostril Every Am 5)  Zyrtec Allergy 10 Mg Tabs (Cetirizine Hcl) .Marland Kitchen.. 1 Daily As Needed For Allergies 6)  Prilosec 20 Mg Cpdr (Omeprazole) .Marland Kitchen.. 1 Every Am 7)  Augmentin 875-125 Mg Tabs (Amoxicillin-Pot Clavulanate) .... One By Mouth Two Times A Day X 14 Days  Allergies: 1)  ! 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 Eyes:  Denies  blurring, discharge, and eye pain. ENT:  Complains of ringing in ears; denies decreased hearing and ear discharge; chronic. CV:  Denies chest pain or discomfort, fainting, fatigue, palpitations, shortness of breath with exertion, swelling of feet, and swelling of hands. Resp:  Complains of chest discomfort; denies cough, shortness of breath, and wheezing; some cough at night with allergies, meds readjusted.Marland Kitchen GI:  Denies abdominal pain, bloody stools, change in bowel habits, constipation, dark tarry stools, diarrhea, indigestion, loss of appetite, nausea, vomiting, vomiting blood, and yellowish skin color. GU:  Complains of nocturia; denies discharge, dysuria, and urinary frequency; oncde. MS:  Complains of joint pain and muscle aches; denies low back pain, cramps, and stiffness; knees. Derm:  Denies dryness, itching, and rash. Neuro:  Denies numbness, poor balance, tingling, and tremors.  Physical Exam  General:  Overweight but generally well appearing, in NAD. Head:  Normocephalic and atraumatic without obvious abnormalities. No apparent alopecia or balding. Eyes:  Conjunctiva clear bilaterally.  Ears:  External ear exam shows no significant lesions or deformities.  Otoscopic examination reveals clear canals, tympanic membranes are intact bilaterally without bulging, retraction, inflammation or discharge. Hearing is grossly normal bilaterally. Nose:  External nasal examination shows no deformity or inflammation. Nasal mucosa are pink and moist without lesions or exudates. Mouth:  Oral mucosa and oropharynx without lesions or exudates.  Teeth in good repair. Neck:  supple with full rom and no masses or thyromegally, no JVD or carotid bruit  Chest Wall:  No deformities, masses, tenderness or gynecomastia noted. Breasts:  No masses or gynecomastia noted Lungs:  Normal respiratory effort, chest expands symmetrically. Lungs are clear to auscultation, no crackles or wheezes. Heart:  Normal rate and  regular rhythm. S1 and S2 normal without gallop, murmur, click, rub or other extra sounds. Abdomen:  Bowel sounds positive,abdomen soft and non-tender without masses, organomegaly or hernias noted. Protuberant. Rectal:  No external abnormalities noted. Normal sphincter tone. No rectal masses or tenderness. G neg. Genitalia:  Testes bilaterally descended without nodularity, tenderness or masses. No scrotal masses or lesions. No penis lesions or urethral discharge. Prostate:  Prostate gland firm and smooth, no enlargement, nodularity, tenderness, mass, asymmetry or induration. 10-20 gms  Msk:  No deformity or scoliosis noted of thoracic or lumbar spine.   Pulses:  R and L carotid,radial,femoral,dorsalis pedis and posterior tibial pulses are full and equal bilaterally Extremities:  No clubbing, cyanosis, edema, or deformity noted with normal full range of motion of all joints.   Neurologic:  No cranial nerve deficits noted. Station and gait are normal. Sensory, motor and coordinative functions appear intact. Skin:  Intact without suspicious lesions or rashes Cervical Nodes:  No lymphadenopathy noted Inguinal Nodes:  No significant adenopathy Psych:  normal affect, talkative and pleasant    Impression & Recommendations:  Problem # 1:  HEALTH MAINTENANCE EXAM (ICD-V70.0)  Problem # 2:  DERMATITIS OF TOES FROM DRYNESS AND ABRASION (ICD-692.9) Assessment: Improved  Doing well. Uses emmolient regularly with good results. His updated medication list for this problem includes:    Zyrtec Allergy 10 Mg Tabs (Cetirizine hcl) .Marland KitchenMarland KitchenMarland KitchenMarland Kitchen  1 daily as needed for allergies  Problem # 3:  OVERWEIGHT (ICD-278.02) Assessment: Unchanged Discussed approach to diet and slow progressive exercise. [place] lots of walking. Discussed being careful with diet intake on the trip.  Problem # 4:  MUSCLE PAIN (ICD-729.1) Assessment: Unchanged Try augmenting with Co Q 10.  Problem # 5:   HYPERCHOLESTEROLEMIA, PURE (ICD-272.0) Assessment: Unchanged All ok except LDL slightly increased. Try to watch intake of fatty food...is close enough he can probably influence acceptably. Labs Reviewed: SGOT: 31 (08/25/2008)   SGPT: 40 (08/25/2008)   HDL:33.0 (02/25/2008), 41.0 (02/15/2007)  LDL:DEL (02/25/2008), DEL (02/15/2007)  Chol:228 (02/25/2008), 205 (02/15/2007)  Trig:161 (02/25/2008), 72 (02/15/2007)  Problem # 6:  HYPERTENSION, BENIGN ESSENTIAL (ICD-401.1) Assessment: Unchanged Adequate control. Cont curr meds. His updated medication list for this problem includes:    Amlodipine Besylate 5 Mg Tabs (Amlodipine besylate) ..... One tab by mouth at night    Lisinopril 20 Mg Tabs (Lisinopril) ..... One tab by mouth at night  BP today: 120/80 Prior BP: 130/80 (06/28/2010)  Labs Reviewed: K+: 3.7 (08/25/2008) Creat: : 1.0 (08/25/2008)   Chol: 228 (02/25/2008)   HDL: 33.0 (02/25/2008)   LDL: DEL (02/25/2008)   TG: 161 (02/25/2008)  Complete Medication List: 1)  Amlodipine Besylate 5 Mg Tabs (Amlodipine besylate) .... One tab by mouth at night 2)  Lisinopril 20 Mg Tabs (Lisinopril) .... One tab by mouth at night 3)  Astelin 137 Mcg/spray Soln (Azelastine hcl) .... Use one spray in each nostril every am 4)  Nasonex 50 Mcg/act Susp (Mometasone furoate) .... Use one spray in each nostril every am 5)  Zyrtec Allergy 10 Mg Tabs (Cetirizine hcl) .Marland Kitchen.. 1 daily as needed for allergies 6)  Prilosec 20 Mg Cpdr (Omeprazole) .Marland Kitchen.. 1 every am  Patient Instructions: 1)  RTC one year or as needed.   Orders Added: 1)  Est. Patient 40-64 years [99396]    Current Allergies (reviewed today): ! NSAIDS ZITHROMAX (AZITHROMYCIN) KETEK (TELITHROMYCIN)

## 2010-11-01 NOTE — Assessment & Plan Note (Signed)
Summary: SINUS INF//VGJ   Vital Signs:  Patient profile:   50 year old male Weight:      275 pounds BMI:     39.60 Temp:     98.2 degrees F Pulse rate:   63 / minute BP sitting:   152 / 86  (left arm)  Vitals Entered By: Lamar Sprinkles, CMA (May 14, 2010 10:07 AM) CC: ? sinus infection, c/o sinus drainage, congestion & ear complaints. Mucus is discolored. Symptoms x 2 wks.    History of Present Illness: thinks he has a sinus infection  tends to have recurrent/ chronic sinus infections  one turned into orbital cellulitis in the past -- and so it makes him nervous   uses his usual meds  also guifenesin with water two times a day   started getting worse last week -- low grade fever for a few days  pain over both eyes with pressure  mostly post nasal drip - constant drainage - upsets stomach  ears are stuffy and full nose is congested  ringing in his ears   some sore throat and hoarseness as well   calf muscles have been killing him  had a bad cramp L leg -- am after going back to the gym no chol med      Current Medications (verified): 1)  Lisinopril 20 Mg  Tabs (Lisinopril) .... One Tab By Mouth At Night 2)  Astelin 137 Mcg/spray Soln (Azelastine Hcl) .... Use One Spray in Each Nostril Every Am 3)  Nasonex 50 Mcg/act Susp (Mometasone Furoate) .... Use One Spray in Each Nostril Every Am 4)  Zyrtec Allergy 10 Mg Tabs (Cetirizine Hcl) .Marland Kitchen.. 1 Daily As Needed For Allergies 5)  Prilosec 20 Mg Cpdr (Omeprazole) .Marland Kitchen.. 1 Every Am 6)  Amlodipine Besylate 5 Mg Tabs (Amlodipine Besylate) .... One Tab By Mouth At Night  Allergies (verified): 1)  ! 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 Eyes:  Denies blurring, discharge, and eye irritation. CV:  Denies chest pain or discomfort, shortness of breath with exertion, and swelling of feet. Resp:  Complains of cough; denies shortness of breath and wheezing. GI:  Denies abdominal pain, change in bowel habits, indigestion, nausea, and vomiting. GU:  Denies dysuria. MS:  Complains of muscle aches, cramps, and stiffness; denies muscle weakness. Derm:  Denies itching, lesion(s), poor wound healing, and rash. Neuro:  Complains of headaches; denies numbness and tingling. Endo:  Denies cold intolerance and heat intolerance. Heme:  Denies abnormal bruising and bleeding.  Physical  Exam  General:  overweight but generally well appearing  Head:  normocephalic, atraumatic, and no abnormalities observed.  bilat ethmoid and maxillary sinus tenderness Eyes:  vision grossly intact, pupils equal, pupils round, and pupils reactive to light.  no conjunctival pallor, injection or icterus  Ears:  R ear normal and L ear normal.   Nose:  nares are injected and congested  bilaterally  Mouth:  pharynx pink and moist, no erythema, and no exudates.   Neck:  supple with full rom and no masses or thyromegally, no JVD or carotid bruit  Lungs:  Normal respiratory effort, chest expands symmetrically. Lungs are clear to auscultation, no crackles or wheezes. Heart:  Normal rate and regular rhythm. S1 and S2 normal without gallop, murmur, click, rub or other extra sounds. Msk:  bilat calves are very slt tender to deep palp  no palp cords/ swelling/ redness or lesions some pain on full stretch / dorsiflexion nl rom nl gait  Pulses:  R and L carotid,radial,femoral,dorsalis pedis and posterior tibial pulses are full and equal bilaterally Extremities:  No clubbing, cyanosis, edema, or deformity noted with normal full range of motion of all joints.   Neurologic:  sensation intact to light touch, gait normal, and DTRs symmetrical and normal.   Skin:  Intact without suspicious lesions or rashes Cervical Nodes:  No lymphadenopathy noted Psych:  normal affect, talkative and pleasant    Impression & Recommendations:  Problem # 1:  SINUSITIS - ACUTE-NOS (ICD-461.9) Assessment New acute (on chronic) sinusitis  tx with augmentin and sympt care pt advised to update me if symptoms worsen or do not improve  His updated medication list for this problem includes:    Astelin 137 Mcg/spray Soln (Azelastine hcl) ..... Use one spray in each nostril every am    Nasonex 50 Mcg/act Susp (Mometasone furoate) ..... Use one spray in each nostril every am    Augmentin 875-125 Mg Tabs (Amoxicillin-pot clavulanate) .Marland Kitchen... 1 by mouth two times a day for 10 days  Problem # 2:  LEG PAIN, BILATERAL (ICD-729.5) Assessment: New in calves after re starting an exercise program -- and having some nocturnal cramps  is gradually getting better  disc 3 diff calf stretches and use of heat  update if not imp   Complete Medication List: 1)  Lisinopril 20 Mg Tabs (Lisinopril) .... One tab by mouth at  night 2)  Astelin 137 Mcg/spray Soln (Azelastine hcl) .... Use one spray in each nostril every am 3)  Nasonex 50 Mcg/act Susp (Mometasone furoate) .... Use one spray in each nostril every am 4)  Zyrtec Allergy 10 Mg Tabs (Cetirizine hcl) .Marland Kitchen.. 1 daily as needed for allergies 5)  Prilosec 20 Mg Cpdr (Omeprazole) .Marland Kitchen.. 1 every am 6)  Amlodipine Besylate 5 Mg Tabs (Amlodipine besylate) .... One tab by mouth at night 7)  Augmentin 875-125 Mg Tabs (Amoxicillin-pot clavulanate) .Marland Kitchen.. 1 by mouth two times a day for 10 days  Patient Instructions: 1)  take the augmentin as directed for sinus infection- I sent to your pharmacy 2)  continue current sinus medicine  3)  if not improved - within 7-10 days - please let me know  Prescriptions: AUGMENTIN 875-125 MG TABS (AMOXICILLIN-POT CLAVULANATE) 1 by mouth two times a day for 10 days  #20 x 0   Entered and Authorized by:   Judith Part MD   Signed by:   Judith Part MD on 05/14/2010   Method used:   Electronically to  [facility] [phone]* (retail)       [address] [place], [place]  [address]       Ph: [phone] or [phone]       Fax: [phone]   RxID:   [id number]

## 2010-11-01 NOTE — Assessment & Plan Note (Signed)
Summary: CONGESTION,DRAINAGE/CLE   Vital Signs:  Patient profile:   50 year old male Weight:      277.25 pounds Temp:     98.6 degrees F oral Pulse rate:   68 / minute Pulse rhythm:   regular BP sitting:   140 / 80  (left arm) Cuff size:   large  Vitals Entered By: Selena Batten Dance CMA Duncan Dull) (September 08, 2010 8:57 AM) CC: Congestion/drainage   History of Present Illness: CC: congestion/drainage  2+ wk h/o sinus congestion, cough, purulent drainage out of nose and cough.  + tooth pain and referred ear pain, max sinus pressure.  Taking guaifenesin which has helped, but now over last 4 days noticing L eye hurting as well as "slimey feeling".  Has neti pot but hasn't used, nor nasal saline.    2 fillings day before thanksgiving. 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  New job puts him in [place] several days a week.  Requests emergency abx prescription to fill in case feels sinus infection coming on, says would schedule f/u appt if ever filled.  Current Medications (verified): 1)  Amlodipine Besylate 5 Mg Tabs (Amlodipine Besylate) .... One Tab By Mouth At Night 2)  Lisinopril 20 Mg  Tabs (Lisinopril) .... One Tab By Mouth At Night 3)  Astelin 137 Mcg/spray Soln (Azelastine Hcl) .... Use One Spray in Each Nostril Every Am 4)  Nasonex 50 Mcg/act Susp (Mometasone Furoate) .... Use One Spray in Each Nostril Every Am 5)  Zyrtec Allergy 10 Mg Tabs (Cetirizine Hcl) .Marland Kitchen.. 1 Daily As Needed For Allergies 6)  Prilosec 20 Mg Cpdr (Omeprazole) .Marland Kitchen.. 1 Every Am  Allergies: 1)  ! 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 Head:  Normocephalic and atraumatic without obvious abnormalities. L maxillary sinus tenderness Eyes:  Conjunctiva clear bilaterally. PERRLA, EOMI without pain Ears:  TMs clear bilaterally, congestion L>R Nose:  External nasal examination shows no deformity or inflammation. Nasal mucosa are pink and moist without lesions or exudates. Mouth:  Oral mucosa and oropharynx without lesions or exudates.  Teeth in good repair. Neck:  no LAD Lungs:  Normal respiratory effort, chest expands symmetrically. Lungs are clear to auscultation, no crackles or wheezes. Heart:  Normal rate and regular rhythm. S1 and S2 normal without gallop, murmur, click, rub or other extra sounds. Pulses:  2+ rad pulses Extremities:  no c/c/e Skin:  Intact without suspicious lesions or rashes   Impression & Recommendations:  Problem # 1:  SINUSITIS, MAXILLARY, ACUTE (ICD-461.0) start amoxicillin x 10 days (pt states works better for him).  continue guaifenesin, neti pot/nasal saline, INS.  red flags to return discussed.  provided with emergency abx script given hx complicated sinusitis, and work requires him to go to [place] several days/wk.  to come in for visit if filled.  His updated medication list for this problem includes:    Astelin 137 Mcg/spray Soln (Azelastine hcl) ..... Use one spray in each nostril every am  Nasonex 50 Mcg/act Susp (Mometasone furoate) ..... Use one spray in each nostril every am    Amoxicillin 875 Mg Tabs (Amoxicillin) ..... One twice daily x 10 days    Cheratussin Ac 100-10 Mg/71ml Syrp (Guaifenesin-codeine) ..... One teaspoon nightly as needed cough  Complete Medication List: 1)  Amlodipine Besylate 5 Mg Tabs (Amlodipine besylate) .... One tab by mouth at night 2)   Lisinopril 20 Mg Tabs (Lisinopril) .... One tab by mouth at night 3)  Astelin 137 Mcg/spray Soln (Azelastine hcl) .... Use one spray in each nostril every am 4)  Nasonex 50 Mcg/act Susp (Mometasone furoate) .... Use one spray in each nostril every am 5)  Zyrtec Allergy 10 Mg Tabs (Cetirizine hcl) .Marland Kitchen.. 1 daily as needed for allergies 6)  Prilosec 20 Mg Cpdr (Omeprazole) .Marland Kitchen.. 1 every am 7)  Amoxicillin 875 Mg Tabs (Amoxicillin) .... One twice daily x 10 days 8)  Cheratussin Ac 100-10 Mg/76ml Syrp (Guaifenesin-codeine) .... One teaspoon nightly as needed cough  Patient Instructions: 1)  You have a sinus infection.  Treat with 10d course antibiotics. 2)  Another 10 d course prescription provided today (emergency script), if filled come see [place].  3)  Continue current sinus treatment, consider starting neti pot or nasal saline spray. 4)  Continue plenty of fluids and rest to help you get over this quicker. 5)  Cheratussin for cough at night. 6)  Pleasure to see you today, call clinic iwth questions. 7)  Let us know if not improving as expected. Prescriptions: AMOXICILLIN 875 MG TABS (AMOXICILLIN) one twice daily x 10 days  #20 x 0   Entered and Authorized by:   Eustaquio Boyden  MD   Signed by:   Eustaquio Boyden  MD on 09/08/2010   Method used:   Print then Give to Patient   RxID:   [id number] CHERATUSSIN AC 100-10 MG/5ML SYRP (GUAIFENESIN-CODEINE) one teaspoon nightly as needed cough  #100cc x 0   Entered and Authorized by:   Eustaquio Boyden  MD   Signed by:   Eustaquio Boyden  MD on 09/08/2010   Method used:   Print then Give to Patient   RxID:   [id number] AMOXICILLIN 875 MG TABS (AMOXICILLIN) one twice daily x 10 days  #20 x 0   Entered and Authorized by:   Eustaquio Boyden  MD   Signed by:   Eustaquio Boyden  MD on 09/08/2010   Method used:   [facility] to        [facility] [phone]* (retail)       [address] [place], [place]   [address]       Ph: [phone] or [phone]       Fax: [phone]   RxID:   [id number]    Orders Added: 1)  Est. Patient Level III [60630]    Current Allergies (reviewed today): ! NSAIDS ZITHROMAX (AZITHROMYCIN) KETEK (TELITHROMYCIN)

## 2010-12-09 ENCOUNTER — Ambulatory Visit (INDEPENDENT_AMBULATORY_CARE_PROVIDER_SITE_OTHER): Payer: BC Managed Care – PPO | Admitting: Family Medicine

## 2010-12-09 ENCOUNTER — Telehealth: Payer: Self-pay | Admitting: Family Medicine

## 2010-12-09 ENCOUNTER — Encounter: Payer: Self-pay | Admitting: Family Medicine

## 2010-12-09 DIAGNOSIS — J329 Chronic sinusitis, unspecified: Secondary | ICD-10-CM

## 2010-12-13 NOTE — Assessment & Plan Note (Signed)
Summary: ?SINUS INFECTION/CLE   [employer]   Vital Signs:  Patient profile:   50 year old male Weight:      272 pounds Temp:     98.1 degrees F oral Pulse rate:   84 / minute Pulse rhythm:   regular BP sitting:   118 / 80  (left arm) Cuff size:   large  Vitals Entered By: Selena Batten Dance CMA Duncan Dull) (December 09, 2010 11:36 AM) CC: ?Sinus infection   History of Present Illness: CC: ? 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 Usually sees Dr. Adele Schilder.  has been fighting with allergy sxs for last few weeks - months.  taking guaifenesin as well as astelin, neti pot, nasonex and zyrtec.  Clear mucous out of nose.  mild cough.  yesterday started having noticeable pain L side of face, over eye and brow and temple.  Started taking abx yesterday (given emergency abx to fill if felt bad last visit).  felt a bit feverish (very low grade) last night, tylenol helped with pain and temp.  "eye hurts really bad like in vise".  eye feels "slimy".  currently on day 2 of amoxicillin.  No vision changes.  No pain with movements of eye.  no swelling, no discharge from eye.  no matting in morning.  intolerant of NSAIDs 2/2 gi issues.   Current Medications (verified): 1)  Amlodipine Besylate 5 Mg Tabs (Amlodipine Besylate) .... One Tab By Mouth At Night 2)  Lisinopril 20 Mg  Tabs (Lisinopril) .... One Tab By Mouth At Night 3)  Astelin 137 Mcg/spray Soln (Azelastine Hcl) .... Use One Spray in Each Nostril Every Am 4)  Nasonex 50 Mcg/act Susp (Mometasone Furoate) .... Use One Spray in Each Nostril Every Am 5)  Zyrtec Allergy 10 Mg Tabs (Cetirizine Hcl) .Marland Kitchen.. 1 Daily As Needed For Allergies 6)  Prilosec 20 Mg Cpdr (Omeprazole) .Marland Kitchen.. 1 Every Am  Allergies: 1)  ! 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 Claris Gower) R/O''d presume secondary nsaids 12/04/05  EGD Mod Gastritis Esophagitis Candida by [facility] (Dr Christella Hartigan) 09/14/08  Review of Systems       per hpi  Physical Exam  General:  Overweight but generally well appearing, in NAD. Head:  Normocephalic and atraumatic without obvious abnormalities. L frontal sinus tenderness Eyes:  PERRLA, EOMI without pain, no proptosis, no orbital swelling R eye slightly more pronounced injection than left side, no discharge. Ears:  TMs clear bilaterally, congestion L>R Nose:  External nasal examination shows no deformity or inflammation. Nasal mucosa are pink and moist without lesions or exudates. Mouth:  Oral mucosa and oropharynx without lesions or exudates.  Teeth in good repair. Neck:  no LAD Lungs:  Normal respiratory effort, chest expands symmetrically. Lungs are clear to auscultation, no crackles or wheezes. Heart:  Normal rate and regular rhythm. S1 and S2 normal without gallop, murmur, click, rub or other extra sounds. 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  continue supportie care as up to now.  provided with another emergency script for amox.  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  given some injection and c/o eye pain, recommended he see ophtho today for evaluation of rpt scleritis.  states he will call to be  seen this afternoon.  advised to call us if trouble getting in and I can call ophtho.  The following medications were removed from the medication list:    Amoxicillin 875 Mg Tabs (Amoxicillin) ..... One twice daily x 10 days    Cheratussin Ac 100-10 Mg/55ml Syrp (Guaifenesin-codeine) ..... One teaspoon nightly as needed cough    Amoxicillin 875 Mg Tabs (Amoxicillin) .Marland Kitchen... Take one twice daily for 10 days His updated medication list for this problem includes:    Astelin 137 Mcg/spray Soln (Azelastine hcl) ..... Use one spray in each nostril every am    Nasonex 50 Mcg/act Susp (Mometasone furoate) ..... Use one spray in each nostril every am    Augmentin 875-125 Mg Tabs (Amoxicillin-pot clavulanate) .Marland Kitchen... Take one twice daily for 10 days  Complete Medication List: 1)  Amlodipine Besylate 5 Mg Tabs (Amlodipine besylate) .... One tab by mouth at night 2)  Lisinopril 20 Mg Tabs (Lisinopril) .... One tab by mouth at night 3)  Astelin 137 Mcg/spray Soln (Azelastine hcl) .... Use one spray in each nostril every am 4)  Nasonex 50 Mcg/act Susp (Mometasone furoate) .... Use one spray in each nostril every am 5)  Zyrtec Allergy 10 Mg Tabs (Cetirizine hcl) .Marland Kitchen.. 1 daily as needed for allergies 6)  Prilosec 20 Mg Cpdr (Omeprazole) .Marland Kitchen.. 1 every am 7)  Augmentin 875-125 Mg Tabs (Amoxicillin-pot clavulanate) .... Take one twice daily for 10 days  Patient Instructions: 1)  broaden antibiotics to augmentin twice daily for 10 days total. 2)  continue current treatment regimen as up to now. 3)  Call to see if Dr. Dellie Burns can see you today. 4)  Good to see you today, call us if any trouble getting in to see him because I'd like you to see him today. Prescriptions: AMOXICILLIN 875 MG TABS (AMOXICILLIN) take one twice daily for 10 days  #20 x 0   Entered and Authorized by:   Eustaquio Boyden  MD   Signed by:   Eustaquio Boyden  MD on 12/09/2010   Method used:   Print then Give to Patient   RxID:    [id number] AUGMENTIN 875-125 MG TABS (AMOXICILLIN-POT CLAVULANATE) take one twice daily for 10 days  #20 x 0   Entered and Authorized by:   Eustaquio Boyden  MD   Signed by:   Eustaquio Boyden  MD on 12/09/2010   Method used:   [facility]        [facility] [phone][facility] (retail)       [address] [place], [place]  [address]       Ph: [phone] or [phone]       Fax: [phone]   RxID:   [id number]    Orders Added: 1)  Est. Patient Level III [01601]    Current Allergies (reviewed today): ! NSAIDS ZITHROMAX (AZITHROMYCIN) KETEK (TELITHROMYCIN)

## 2010-12-13 NOTE — Progress Notes (Signed)
Summary: pt saw eye doctor  Phone Note Call from Patient   Caller: Patient Call For: Dr. Sharen Hones Summary of Call: Pt wanted you to know that he did see his eye doctor today and he didnt see any sign of orbital cellulitis.  They are observing for now and pt is going back there next week for a follow up. Initial call taken by: Lowella Petties CMA, AAMA,  December 09, 2010 3:36 PM  Follow-up for Phone Call        noted.  thanks. wanted him checked for ocular scleritis.   Follow-up by: Eustaquio Boyden  MD,  December 09, 2010 3:40 PM

## 2010-12-23 ENCOUNTER — Other Ambulatory Visit: Payer: Self-pay | Admitting: Family Medicine

## 2011-02-03 ENCOUNTER — Other Ambulatory Visit: Payer: Self-pay | Admitting: Family Medicine

## 2011-02-10 ENCOUNTER — Other Ambulatory Visit: Payer: Self-pay | Admitting: Family Medicine

## 2011-03-29 ENCOUNTER — Encounter: Payer: Self-pay | Admitting: Family Medicine

## 2011-03-30 ENCOUNTER — Encounter: Payer: Self-pay | Admitting: Family Medicine

## 2011-03-30 ENCOUNTER — Ambulatory Visit (INDEPENDENT_AMBULATORY_CARE_PROVIDER_SITE_OTHER): Payer: BC Managed Care – PPO | Admitting: Family Medicine

## 2011-03-30 VITALS — BP 122/80 | HR 72 | Temp 98.3°F | Ht 70.0 in | Wt 272.8 lb

## 2011-03-30 DIAGNOSIS — J019 Acute sinusitis, unspecified: Secondary | ICD-10-CM | POA: Insufficient documentation

## 2011-03-30 MED ORDER — AMOXICILLIN-POT CLAVULANATE 875-125 MG PO TABS: 1.0000 | ORAL_TABLET | Freq: Two times a day (BID) | ORAL | Status: DC

## 2011-03-30 MED ORDER — AMOXICILLIN-POT CLAVULANATE 875-125 MG PO TABS: 1.0000 | ORAL_TABLET | Freq: Two times a day (BID) | ORAL | Status: AC

## 2011-03-30 NOTE — Assessment & Plan Note (Addendum)
H/o mult sinus surgeries. 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 Has had 4 days amox.  Finish 10 d course with change to augmentin (7 more days). Update if red flags. Provided with emergency script for augmentin.  Pt knows to come in if starts abx.

## 2011-03-30 NOTE — Patient Instructions (Signed)
You have a sinus infection. Take medicine as prescribed: change to augmentin x 7 days.  Refill for 10 day course of augmentin provided today. Push fluids and plenty of rest. Nasal saline irrigation or neti pot to help drain sinuses. May use simple mucinex with plenty of fluid to help mobilize mucous. Return if fever >101.5, trouble opening/closing mouth, difficulty swallowing, or worsening.

## 2011-03-30 NOTE — Progress Notes (Signed)
  Subjective:    Patient ID: Gabriel [name], male    DOB: Dec 19, 1960, 50 y.o.   MRN: [id number]  HPI CC: sinus infection?  Concerned with return of sinus infection.  Started about 1 wk ago with sinus headache, constant frontal, some right maxilla.  Took some amoxicillin from emergency prescription he had for sinusitis at home.  Some better.  Feeling more drainage coming out.  Helped with headache as well.  So far has taken 4 days amoxicillin.  Has mild cough.  Had some tooth work last week as well.  Takes nasonex, astelin, zyrtec daily.  Has also tried OTC guaifenesin with plenty of fluid.  Has had multiple sinus surgeries in past.  Has been on singulair in past, didn't really help.  No fevers/chills, abd pain, n/v/d.  h/o recurrent sinus infection in past as well as complicated by ocular scleritis.  + sick contacts at home - family with sinus sxs.  New job puts him in [place] several days a week. Requests emergency abx prescription to fill in case feels sinus infection coming on, says would schedule f/u appt if ever filled.   Review of Systems Per HPI    Objective:   Physical Exam  Nursing note and vitals reviewed. Constitutional: He appears well-developed and well-nourished. No distress.  HENT:  Head: Normocephalic and atraumatic.  Right Ear: Hearing, tympanic membrane, external ear and ear canal normal.  Left Ear: Hearing, tympanic membrane, external ear and ear canal normal.  Nose: No mucosal edema or rhinorrhea. Right sinus exhibits frontal sinus tenderness. Right sinus exhibits no maxillary sinus tenderness. Left sinus exhibits frontal sinus tenderness. Left sinus exhibits no maxillary sinus tenderness.  Mouth/Throat: Uvula is midline, oropharynx is clear and moist and mucous membranes are normal. No oropharyngeal exudate, posterior oropharyngeal edema, posterior oropharyngeal erythema or tonsillar abscesses.  Eyes: Conjunctivae and EOM are normal. Pupils are equal,  round, and reactive to light. No scleral icterus.  Neck: Normal range of motion. Neck supple. No thyromegaly present.  Cardiovascular: Normal rate, regular rhythm, normal heart sounds and intact distal pulses.   No murmur heard. Pulmonary/Chest: Effort normal and breath sounds normal. No respiratory distress. He has no wheezes. He has no rales.  Lymphadenopathy:    He has no cervical adenopathy.  Skin: Skin is warm and dry. No rash noted.          Assessment & Plan:

## 2011-05-05 ENCOUNTER — Other Ambulatory Visit: Payer: Self-pay | Admitting: Family Medicine

## 2011-06-16 ENCOUNTER — Other Ambulatory Visit: Payer: Self-pay | Admitting: *Deleted

## 2011-06-16 NOTE — Telephone Encounter (Signed)
Last refilled 11/19/2010, please advise.

## 2011-06-17 MED ORDER — AZELASTINE HCL 0.1 % NA SOLN: 1.0000 | NASAL | Status: DC

## 2011-06-17 NOTE — Telephone Encounter (Signed)
Will refill with one added refill. Pls have pt make appt for PE in Oct.

## 2011-06-28 NOTE — Telephone Encounter (Signed)
Patient advised via telephone, he stated that he has an appt scheduled for November.

## 2011-07-22 ENCOUNTER — Other Ambulatory Visit: Payer: Self-pay | Admitting: Family Medicine

## 2011-08-07 ENCOUNTER — Other Ambulatory Visit (INDEPENDENT_AMBULATORY_CARE_PROVIDER_SITE_OTHER): Payer: BC Managed Care – PPO

## 2011-08-07 DIAGNOSIS — E7800 Pure hypercholesterolemia, unspecified: Secondary | ICD-10-CM

## 2011-08-07 DIAGNOSIS — I10 Essential (primary) hypertension: Secondary | ICD-10-CM

## 2011-08-07 DIAGNOSIS — Z125 Encounter for screening for malignant neoplasm of prostate: Secondary | ICD-10-CM

## 2011-08-10 ENCOUNTER — Ambulatory Visit (INDEPENDENT_AMBULATORY_CARE_PROVIDER_SITE_OTHER): Payer: BC Managed Care – PPO | Admitting: Family Medicine

## 2011-08-10 ENCOUNTER — Encounter: Payer: Self-pay | Admitting: Family Medicine

## 2011-08-10 DIAGNOSIS — I10 Essential (primary) hypertension: Secondary | ICD-10-CM

## 2011-08-10 DIAGNOSIS — E7800 Pure hypercholesterolemia, unspecified: Secondary | ICD-10-CM

## 2011-08-10 DIAGNOSIS — E663 Overweight: Secondary | ICD-10-CM

## 2011-08-10 DIAGNOSIS — J019 Acute sinusitis, unspecified: Secondary | ICD-10-CM

## 2011-08-10 NOTE — Progress Notes (Signed)
  Subjective:    Patient ID: Gabriel [name], male    DOB: 08/11/1961, 50 y.o.   MRN: [id number]  HPI Pt here for Comp Exam. He has been seen a few times in the last year for congestion and sinus infection and has done ok. He again started Amox over the weekend. He is now draining and fee;ling better.We discussed taking Guaifenesin daily.  He otherwise feels well and has no other complaints.    Review of Systems  Constitutional: Negative for fever, chills, diaphoresis, appetite change, fatigue and unexpected weight change.  HENT: Negative for hearing loss, ear pain, tinnitus and ear discharge.   Eyes: Positive for visual disturbance (now uses reading glasses.). Negative for pain and discharge.  Respiratory: Negative for cough, shortness of breath and wheezing.   Cardiovascular: Negative for chest pain and palpitations.       No SOB w/ exertion  Gastrointestinal: Negative for nausea, vomiting, abdominal pain, diarrhea, constipation and blood in stool.       No heartburn or swallowing problems.  Genitourinary: Negative for dysuria, frequency and difficulty urinating.       Mild nocturia, once to twice a night.  Musculoskeletal: Positive for arthralgias (mild in fingers and wrists.). Negative for myalgias and back pain.  Skin: Negative for rash.       No itching or dryness.  Neurological: Negative for tremors and numbness.       Mild  Tingling in fingers and toes at times but no balance problems.  Hematological: Negative for adenopathy. Does not bruise/bleed easily.  Psychiatric/Behavioral: Negative for dysphoric mood and agitation.       Objective:   Physical Exam  Constitutional: He is oriented to person, place, and time. He appears well-developed and well-nourished. No distress.       Obese.  HENT:  Head: Normocephalic and atraumatic.  Right Ear: External ear normal.  Left Ear: External ear normal.  Nose: Nose normal.  Mouth/Throat: Oropharynx is clear and moist.  Eyes:  Conjunctivae and EOM are normal. Pupils are equal, round, and reactive to light. Right eye exhibits no discharge. Left eye exhibits no discharge. No scleral icterus.  Neck: Normal range of motion. Neck supple. No thyromegaly present.  Cardiovascular: Normal rate, regular rhythm, normal heart sounds and intact distal pulses.   No murmur heard. Pulmonary/Chest: Effort normal and breath sounds normal. No respiratory distress. He has no wheezes.  Abdominal: Soft. Bowel sounds are normal. He exhibits no distension and no mass. There is no tenderness. There is no rebound and no guarding.  Genitourinary: Rectum normal, prostate normal and penis normal. Guaiac negative stool.       Prostate 30 grams smooth symmetric raphe intact w/o nodularity.  Musculoskeletal: Normal range of motion. He exhibits no edema.  Lymphadenopathy:    He has no cervical adenopathy.  Neurological: He is alert and oriented to person, place, and time. Coordination normal.  Skin: Skin is warm and dry. No rash noted. He is not diaphoretic.  Psychiatric: He has a normal mood and affect. His behavior is normal. Judgment and thought content normal.          Assessment & Plan:  HMPE

## 2011-08-10 NOTE — Assessment & Plan Note (Signed)
Good control, cont meds as prescribed. BP Readings from Last 3 Encounters:  08/10/11 128/88  03/30/11 122/80  12/09/10 118/80

## 2011-08-10 NOTE — Assessment & Plan Note (Signed)
Is currently on Amox and Guaif. Discussed latest findings that trmt of sinusitis and OM both do just as well with no Abs as with. The real issue for him is taking guaif to rid himself of congested mucous.

## 2011-08-10 NOTE — Patient Instructions (Signed)
RTC one year, sooner as needed. Suggest you take one dose of guaifenesin (400mg ) every morning to rid the head of chronic unwanted mucous that has been causing congestion and discomfort.

## 2011-08-10 NOTE — Assessment & Plan Note (Signed)
Discussed lipid panel results. LDL too high. He declines meds today. Discussed avoiding fatty foods to decrease LDL which weight loss will also help. Exercise will improve the HDL. Lab Results  Component Value Date   CHOL 228* 02/25/2008   HDL 33.0* 02/25/2008   LDLDIRECT 165.9 02/25/2008   TRIG 161* 02/25/2008   CHOLHDL 6.9 CALC 02/25/2008

## 2011-08-10 NOTE — Assessment & Plan Note (Signed)
Needs to lose weight which will help multiple parameters and allow him to feel better as well.

## 2011-08-15 ENCOUNTER — Other Ambulatory Visit: Payer: Self-pay | Admitting: Family Medicine

## 2011-08-22 ENCOUNTER — Other Ambulatory Visit: Payer: Self-pay | Admitting: Family Medicine

## 2011-10-09 ENCOUNTER — Encounter: Payer: Self-pay | Admitting: Family Medicine

## 2011-10-09 ENCOUNTER — Ambulatory Visit (INDEPENDENT_AMBULATORY_CARE_PROVIDER_SITE_OTHER): Payer: BC Managed Care – PPO | Admitting: Family Medicine

## 2011-10-09 VITALS — BP 138/82 | HR 60 | Temp 98.4°F | Wt 275.0 lb

## 2011-10-09 DIAGNOSIS — J329 Chronic sinusitis, unspecified: Secondary | ICD-10-CM | POA: Insufficient documentation

## 2011-10-09 MED ORDER — MOMETASONE FUROATE 50 MCG/ACT NA SUSP: 2.0000 | Freq: Every day | NASAL | Status: DC

## 2011-10-09 MED ORDER — AMOXICILLIN-POT CLAVULANATE 875-125 MG PO TABS: 1.0000 | ORAL_TABLET | Freq: Two times a day (BID) | ORAL | Status: AC

## 2011-10-09 NOTE — Patient Instructions (Signed)
Recurrent sinus infection. You have a sinus infection. Take medicine as prescribed: augmentin.  Restart nasonex Push fluids and plenty of rest. Nasal saline irrigation or neti pot to help drain sinuses. May use simple mucinex with plenty of fluid to help mobilize mucous. Let us know if fever >101.5, trouble opening/closing mouth, difficulty swallowing, or worsening - you may need to be seen again.

## 2011-10-09 NOTE — Assessment & Plan Note (Signed)
Recurrent sinusitis. Treat with augmentin. Has emergency amox script to hold on to. Update [place] if not improving. Supportive care as per instructions Refilled nasonex

## 2011-10-09 NOTE — Progress Notes (Signed)
  Subjective:    Patient ID: Gabriel [name], male    DOB: 11-Mar-1961, 51 y.o.   MRN: [id number]  HPI CC: rpt sinus infection  H/o mult sinus surgeries. 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  Pt states orbital cellulitis.  This one started prior to christmas.  Has had nagging frontal headache daily.  + drainage, night time coughing, nasal congestion getting worse.  Yellow, thick mucous.  Eyes hurting, especially left side.  No fevers/chills, abd pain, n/v.    No sick contacts, no smokers at home.  Recent trip back from [place].  Planning trip to [place] next week.  Currently doesn't see ENT, prior saw [facility].  Review of Systems Per HPI    Objective:   Physical Exam  Nursing note and vitals reviewed. Constitutional: He appears well-developed and well-nourished. No distress.  HENT:  Head: Normocephalic and atraumatic.  Right Ear: Hearing, tympanic membrane, external ear and ear canal normal.  Left Ear: Hearing, tympanic membrane, external ear and ear canal normal.  Nose: No mucosal edema or rhinorrhea. Right sinus exhibits no maxillary sinus tenderness and no frontal sinus tenderness. Left sinus exhibits frontal sinus tenderness (mild). Left sinus exhibits no maxillary sinus tenderness.  Mouth/Throat: Uvula is midline, oropharynx is clear and moist and mucous membranes are normal. No oropharyngeal exudate, posterior oropharyngeal edema, posterior oropharyngeal erythema or tonsillar abscesses.       3-4+ tonsils, not erythematous  Eyes: Conjunctivae and EOM are normal. Pupils are equal, round, and reactive to light. No scleral icterus.  Neck: Normal range of motion. Neck supple.  Cardiovascular: Normal rate, regular rhythm, normal heart sounds and intact distal pulses.   No murmur heard. Pulmonary/Chest: Effort normal and breath sounds normal. No respiratory distress. He has no wheezes. He has no rales.  Lymphadenopathy:    He has no cervical adenopathy.  Skin: Skin is  warm and dry. No rash noted.       Assessment & Plan:

## 2012-01-12 ENCOUNTER — Other Ambulatory Visit: Payer: Self-pay | Admitting: Family Medicine

## 2012-04-25 ENCOUNTER — Encounter: Payer: Self-pay | Admitting: Family Medicine

## 2012-04-25 ENCOUNTER — Ambulatory Visit (INDEPENDENT_AMBULATORY_CARE_PROVIDER_SITE_OTHER): Payer: BC Managed Care – PPO | Admitting: Family Medicine

## 2012-04-25 VITALS — BP 112/80 | HR 57 | Temp 98.4°F | Wt 275.0 lb

## 2012-04-25 DIAGNOSIS — R6884 Jaw pain: Secondary | ICD-10-CM

## 2012-04-25 DIAGNOSIS — J329 Chronic sinusitis, unspecified: Secondary | ICD-10-CM

## 2012-04-25 DIAGNOSIS — R0982 Postnasal drip: Secondary | ICD-10-CM

## 2012-04-25 MED ORDER — AZELASTINE HCL 0.1 % NA SOLN: 1.0000 | NASAL | Status: DC

## 2012-04-25 MED ORDER — MOMETASONE FUROATE 50 MCG/ACT NA SUSP: 2.0000 | Freq: Every day | NASAL | Status: DC

## 2012-04-25 MED ORDER — AMOXICILLIN-POT CLAVULANATE 875-125 MG PO TABS: 1.0000 | ORAL_TABLET | Freq: Two times a day (BID) | ORAL | Status: AC

## 2012-04-25 NOTE — Patient Instructions (Signed)
This doesn't seem like sinus infection currently. Continue what you're doing up to now. May have component of TMJ dysfunction - do isometric exercises as we discussed. Good to see you, call us with questions. Script for antibiotic printed out in case sinus symptoms progress.

## 2012-04-25 NOTE — Assessment & Plan Note (Signed)
No evidence of sinus infection today.  But did provide with script for abx given h/o recurrent and complicated sinusitis in past.

## 2012-04-25 NOTE — Progress Notes (Signed)
  Subjective:    Patient ID: Gabriel [name], male    DOB: 07/25/1961, 51 y.o.   MRN: [id number]  HPI CC: sinusitis?  H/o mult sinus surgeries. Actually good year.  Hasn't had sinus infection since 10/2011. 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 Pt states orbital cellulitis.  About 2 wks ago started with left ear itching, some drainage and coughing.  That seemed to get better with guaifenesin.  Then this week started having facial pain.  Yesterday worsening drainage, PNdrainage.   Mild cough.  Also with intermittent L jaw pain.  Has tried guaifenesin with water.  Takes zyrtec, nasonex, astelin regularly.  Uses neti pot intermittently.  No fevers/chills, nausea, headaches, ST, tooth pain, chest pain/sob.  Not really blowing nose.  To travel soon - [place] cruise.  No sick contacts at home.  No smokers at home.  Stopped chewing gum.  May be clenching jaw 2/2 stress at work.  Wt Readings from Last 3 Encounters:  04/25/12 275 lb (124.739 kg)  10/09/11 275 lb (124.739 kg)  08/10/11 274 lb (124.286 kg)  obesity - sedentary job.  Wants to lose weight.  Has started running in am.  Working on high protein diet.  Review of Systems Per HPI    Objective:   Physical Exam  Nursing note and vitals reviewed. Constitutional: He appears well-developed and well-nourished. No distress.  HENT:  Head: Normocephalic and atraumatic.  Right Ear: Hearing, tympanic membrane, external ear and ear canal normal.  Left Ear: Hearing, tympanic membrane, external ear and ear canal normal.  Nose: Nose normal. No mucosal edema or rhinorrhea. Right sinus exhibits no maxillary sinus tenderness and no frontal sinus tenderness. Left sinus exhibits no maxillary sinus tenderness and no frontal sinus tenderness.  Mouth/Throat: Uvula is midline, oropharynx is clear and moist and mucous membranes are normal. No oropharyngeal exudate, posterior oropharyngeal edema, posterior oropharyngeal erythema or tonsillar abscesses.      No tenderness at TMJ but decreased motion at TMJ on left  Eyes: Conjunctivae and EOM are normal. Pupils are equal, round, and reactive to light. No scleral icterus.  Neck: Normal range of motion. Neck supple.  Cardiovascular: Normal rate, regular rhythm, normal heart sounds and intact distal pulses.   No murmur heard. Pulmonary/Chest: Effort normal and breath sounds normal. No respiratory distress. He has no wheezes. He has no rales.  Lymphadenopathy:    He has no cervical adenopathy.  Skin: Skin is warm and dry. No rash noted.       Assessment & Plan:

## 2012-04-25 NOTE — Assessment & Plan Note (Signed)
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 Discussed this as well as isometric exercises for jaw stretching/strengthening. Does not tolerate NSAIDs, may use tylenol.

## 2012-05-31 ENCOUNTER — Encounter: Payer: Self-pay | Admitting: Family Medicine

## 2012-05-31 ENCOUNTER — Ambulatory Visit (INDEPENDENT_AMBULATORY_CARE_PROVIDER_SITE_OTHER): Payer: BC Managed Care – PPO | Admitting: Family Medicine

## 2012-05-31 VITALS — BP 118/86 | HR 64 | Temp 98.0°F | Wt 274.5 lb

## 2012-05-31 DIAGNOSIS — I10 Essential (primary) hypertension: Secondary | ICD-10-CM

## 2012-05-31 DIAGNOSIS — M79609 Pain in unspecified limb: Secondary | ICD-10-CM

## 2012-05-31 NOTE — Progress Notes (Signed)
  Subjective:    Patient ID: Gabriel [name], male    DOB: 11-02-1960, 51 y.o.   MRN: [id number]  HPI CC: discuss bp meds  BP Readings from Last 3 Encounters:  05/31/12 118/86  04/25/12 112/80  10/09/11 138/82  wonders if able to come off bp med.  Endorses mild dizziness as well as slower heart rate.  Started exercising recently - last few months.  Started running 20-69min/day. Wt Readings from Last 3 Encounters:  05/31/12 274 lb 8 oz (124.512 kg)  04/25/12 275 lb (124.739 kg)  10/09/11 275 lb (124.739 kg)   Checks bp at home, runs 120/80.   Last blood work 08/2011 - under [employer].  ongoing leg pain for last few years.  Calf muscles ache.  Bad nocturnal cramps.  Not related to running.  Review of Systems Per HPI    Objective:   Physical Exam  Vitals reviewed. Constitutional: He appears well-developed and well-nourished. No distress.  HENT:  Head: Normocephalic and atraumatic.  Mouth/Throat: Oropharynx is clear and moist. No oropharyngeal exudate.  Eyes: Conjunctivae and EOM are normal. Pupils are equal, round, and reactive to light. No scleral icterus.  Neck: Normal range of motion. Neck supple. Carotid bruit is not present.  Cardiovascular: Normal rate, regular rhythm, normal heart sounds and intact distal pulses.   No murmur heard. Pulmonary/Chest: Effort normal and breath sounds normal. No respiratory distress. He has no wheezes. He has no rales.  Musculoskeletal: He exhibits no edema.  Skin: Skin is warm and dry. No rash noted.       Assessment & Plan:

## 2012-05-31 NOTE — Patient Instructions (Signed)
Back off amlodipine to 2.5mg  daily.  Monitor blood pressures - goal for you is <140/90.   Try jogging in a jug or tonic water at night for leg cramping. Continue CoQ10. Return in winter for physical. Good to see you today, call us with quesitons.

## 2012-05-31 NOTE — Assessment & Plan Note (Signed)
Could very well be due to amlodipine - causing leg cramping. Will do trial of lower dose and see if BP controlled with this. Discussed trying tonic water or jogging in a jar.

## 2012-05-31 NOTE — Assessment & Plan Note (Signed)
Will slowly titrate off amlodipine, start at 2.5mg  daily. See if improvement in muscle cramps. Continue lisinopril daily. recheck at f/u for physical in winter.

## 2012-08-13 ENCOUNTER — Other Ambulatory Visit: Payer: Self-pay | Admitting: Family Medicine

## 2012-08-15 ENCOUNTER — Other Ambulatory Visit: Payer: Self-pay | Admitting: Family Medicine

## 2012-08-15 DIAGNOSIS — E7800 Pure hypercholesterolemia, unspecified: Secondary | ICD-10-CM

## 2012-08-15 DIAGNOSIS — R739 Hyperglycemia, unspecified: Secondary | ICD-10-CM

## 2012-08-22 ENCOUNTER — Other Ambulatory Visit (INDEPENDENT_AMBULATORY_CARE_PROVIDER_SITE_OTHER): Payer: BC Managed Care – PPO

## 2012-08-22 DIAGNOSIS — I10 Essential (primary) hypertension: Secondary | ICD-10-CM

## 2012-08-22 DIAGNOSIS — R7309 Other abnormal glucose: Secondary | ICD-10-CM

## 2012-08-22 DIAGNOSIS — E7800 Pure hypercholesterolemia, unspecified: Secondary | ICD-10-CM

## 2012-08-22 DIAGNOSIS — R739 Hyperglycemia, unspecified: Secondary | ICD-10-CM

## 2012-08-23 LAB — BASIC METABOLIC PANEL
BUN/Creatinine Ratio: 14 (ref 9–20)
BUN: 14 mg/dL (ref 6–24)
CO2: 23 mmol/L (ref 19–28)
Chloride: 99 mmol/L (ref 97–108)
GFR calc Af Amer: 98 mL/min/{1.73_m2} (ref >59)
Sodium: 140 mmol/L (ref 134–144)

## 2012-08-23 LAB — LIPID PANEL
Chol/HDL Ratio: 4.8 ratio units (ref 0.0–5.0)
HDL: 42 mg/dL (ref >39)
LDL Calculated: 141 mg/dL — ABNORMAL HIGH (ref 0–99)
Triglycerides: 98 mg/dL (ref 0–149)

## 2012-08-23 LAB — HEMOGLOBIN A1C
Est. average glucose Bld gHb Est-mCnc: 134 mg/dL
Hgb A1c MFr Bld: 6.3 % — ABNORMAL HIGH (ref 4.8–5.6)

## 2012-08-26 ENCOUNTER — Ambulatory Visit (INDEPENDENT_AMBULATORY_CARE_PROVIDER_SITE_OTHER): Payer: BC Managed Care – PPO | Admitting: Family Medicine

## 2012-08-26 ENCOUNTER — Encounter: Payer: Self-pay | Admitting: Family Medicine

## 2012-08-26 VITALS — BP 136/82 | HR 64 | Temp 98.1°F | Ht 71.0 in | Wt 262.2 lb

## 2012-08-26 DIAGNOSIS — I10 Essential (primary) hypertension: Secondary | ICD-10-CM

## 2012-08-26 DIAGNOSIS — Z1211 Encounter for screening for malignant neoplasm of colon: Secondary | ICD-10-CM

## 2012-08-26 DIAGNOSIS — Z0000 Encounter for general adult medical examination without abnormal findings: Secondary | ICD-10-CM | POA: Insufficient documentation

## 2012-08-26 DIAGNOSIS — IMO0002 Reserved for concepts with insufficient information to code with codable children: Secondary | ICD-10-CM

## 2012-08-26 DIAGNOSIS — E119 Type 2 diabetes mellitus without complications: Secondary | ICD-10-CM | POA: Insufficient documentation

## 2012-08-26 DIAGNOSIS — E7800 Pure hypercholesterolemia, unspecified: Secondary | ICD-10-CM

## 2012-08-26 DIAGNOSIS — Z125 Encounter for screening for malignant neoplasm of prostate: Secondary | ICD-10-CM

## 2012-08-26 DIAGNOSIS — R7309 Other abnormal glucose: Secondary | ICD-10-CM

## 2012-08-26 DIAGNOSIS — E669 Obesity, unspecified: Secondary | ICD-10-CM

## 2012-08-26 DIAGNOSIS — R7303 Prediabetes: Secondary | ICD-10-CM

## 2012-08-26 HISTORY — DX: Reserved for concepts with insufficient information to code with codable children: IMO0002

## 2012-08-26 LAB — POC HEMOCCULT BLD/STL (OFFICE/1-CARD/DIAGNOSTIC): Fecal Occult Blood, POC: NEGATIVE

## 2012-08-26 NOTE — Assessment & Plan Note (Signed)
Great progress with nutri system.  Discussed sustainability of this.

## 2012-08-26 NOTE — Assessment & Plan Note (Signed)
Preventative protocols reviewed and updated unless pt declined. Discussed healthy diet and lifestyle. PSA drawn today. DRE reassuring. Refer for colonoscopy. Decided to wait until next year for Td.

## 2012-08-26 NOTE — Addendum Note (Signed)
Addended by: Eustaquio Boyden on: 08/26/2012 01:16 PM   Modules accepted: Orders

## 2012-08-26 NOTE — Assessment & Plan Note (Signed)
Chronic, stable. Continue to monitor on meds.

## 2012-08-26 NOTE — Assessment & Plan Note (Signed)
Mild, off meds.  Continue to monitor  Goal LDL <130.

## 2012-08-26 NOTE — Patient Instructions (Signed)
Blood work to check PSA today. Pass by Marion's office for referral for colonoscopy in the new year. Good to see you today, call us with questions. Great job with weight!

## 2012-08-26 NOTE — Assessment & Plan Note (Signed)
Discussed dx.  Encouraged weight loss. Lab Results  Component Value Date   HGBA1C 6.3* 08/22/2012

## 2012-08-26 NOTE — Progress Notes (Signed)
Subjective:    Patient ID: Gabriel [name], male    DOB: Dec 09, 1960, 51 y.o.   MRN: [id number]  HPI CC: annual exam  No concerns today.  Started nutrisiystem.  Last month went on cruise.  Maintained weight.  12lbs weight loss noted! Wt Readings from Last 3 Encounters:  08/26/12 262 lb 4 oz (118.956 kg)  05/31/12 274 lb 8 oz (124.512 kg)  04/25/12 275 lb (124.739 kg)   Last saw eye doctor 1 yr ago.  Preventative: Last CPE was 08/2011 with Dr. Hetty Ely Prostate exam normal at that time.  Discussed.  Will continue to screen. Colon cancer screening - no fam hx colon cancer.  No blood in stool or BM changes.  Would like colonoscopy.   Declines flu shot. Td 10/2002.  Seat belt use discussed. Sunscreen use discussed.  Medications and allergies reviewed and updated in chart.  Past histories reviewed and updated if relevant as below. Patient Active Problem List  Diagnosis  . CANDIDIASIS OF THE ESOPHAGUS  . HYPERCHOLESTEROLEMIA, PURE  . OVERWEIGHT  . HYPERTENSION, BENIGN ESSENTIAL  . SINUS BRADYCARDIA  . MUSCLE PAIN  . LEG PAIN, BILATERAL  . FATIGUE  . Recurrent sinusitis  . Jaw pain  . Post-nasal drainage   Past Medical History  Diagnosis Date  . GERD (gastroesophageal reflux disease)   . Hypertension   . Obesity   . 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 (Sprehe ophtho)   Past Surgical History  Procedure Date  . Antral window/turbinectomy 1992    sinus surgery  . Ett 2007    [facility]NL, [facility]) r/o presumed 2/2 NSAIDs  . Esophagogastroduodenoscopy 2009    gastritis, esophagitis, candida by culture (Dr. Christella Hartigan)   History  Substance Use Topics  . 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 Smokeless tobacco: Never Used  . Alcohol Use: 0.5 oz/week    1 drink(s) per week     Comment: occassional wine     Family History  Problem Relation Age of Onset  . Hypertension Mother   . Cataracts Mother   . Heart disease Mother     valvular disease  . Heart disease Maternal Grandfather     coronary artery disease.   Allergies  Allergen Reactions  . Azithromycin     REACTION: rash  . Ketek (Telithromycin)     REACTION: rash  . Nsaids     REACTION: GASTRITIS  . Erythromycin Rash   Current Outpatient Prescriptions on File Prior to Visit  Medication Sig Dispense Refill  . amLODipine (NORVASC) 5 MG tablet Take 5 mg by mouth daily.       Marland Kitchen azelastine (ASTELIN) 137 MCG/SPRAY nasal spray Place 1 spray into the nose every morning. Use in each nostril as directed  30 mL  11  . cetirizine (ZYRTEC) 10 MG tablet Take 10 mg by mouth daily as needed.       . Coenzyme Q10 (COQ-10) 200 MG CAPS Take by mouth daily.        . lansoprazole (PREVACID) 30 MG capsule Take 30 mg by mouth daily.        Marland Kitchen lisinopril (PRINIVIL,ZESTRIL) 20 MG tablet ONE TAB BY MOUTH AT NIGHT  30 tablet  6  . Misc Natural Products (OSTEO BI-FLEX ADV JOINT SHIELD PO) Take by mouth daily.      Marland Kitchen  mometasone (NASONEX) 50 MCG/ACT nasal spray Place 2 sprays into the nose daily.  17 g  11  . [DISCONTINUED] lisinopril (PRINIVIL,ZESTRIL) 20 MG tablet          Review of Systems  Constitutional: Negative for fever, chills, activity change, appetite change, fatigue and unexpected weight change.  HENT: Negative for hearing loss and neck pain.   Eyes: Negative for visual disturbance.  Respiratory: Negative for cough, chest tightness, shortness of breath and wheezing.   Cardiovascular: Negative for chest pain, palpitations and leg swelling.  Gastrointestinal: Negative for nausea, vomiting, abdominal pain, diarrhea, constipation, blood in stool and abdominal distention.  Genitourinary: Negative for hematuria and difficulty urinating.  Musculoskeletal: Negative for myalgias and arthralgias.  Skin: Negative for rash.  Neurological: Negative for  dizziness, seizures, syncope and headaches.  Hematological: Does not bruise/bleed easily.  Psychiatric/Behavioral: Negative for dysphoric mood. The patient is not nervous/anxious.        Objective:   Physical Exam  Nursing note and vitals reviewed. Constitutional: He is oriented to person, place, and time. He appears well-developed and well-nourished. No distress.       obese  HENT:  Head: Normocephalic and atraumatic.  Right Ear: Hearing, tympanic membrane, external ear and ear canal normal.  Left Ear: Hearing, tympanic membrane, external ear and ear canal normal.  Nose: Nose normal.  Mouth/Throat: Oropharynx is clear and moist. No oropharyngeal exudate.  Eyes: Conjunctivae normal and EOM are normal. Pupils are equal, round, and reactive to light. No scleral icterus.  Neck: Normal range of motion. Neck supple. Carotid bruit is not present.  Cardiovascular: Normal rate, regular rhythm, normal heart sounds and intact distal pulses.   No murmur heard. Pulses:      Radial pulses are 2+ on the right side, and 2+ on the left side.  Pulmonary/Chest: Effort normal and breath sounds normal. No respiratory distress. He has no wheezes. He has no rales.  Abdominal: Soft. Bowel sounds are normal. He exhibits no distension and no mass. There is no tenderness. There is no rebound and no guarding.  Genitourinary: Rectum normal and prostate normal. Rectal exam shows no external hemorrhoid, no internal hemorrhoid, no fissure, no mass, no tenderness and anal tone normal. Guaiac negative stool. Prostate is not enlarged (10-15gm) and not tender.  Musculoskeletal: Normal range of motion. He exhibits no edema.  Lymphadenopathy:    He has no cervical adenopathy.  Neurological: He is alert and oriented to person, place, and time.       CN grossly intact, station and gait intact  Skin: Skin is warm and dry. No rash noted.  Psychiatric: He has a normal mood and affect. His behavior is normal. Judgment and  thought content normal.      Assessment & Plan:

## 2012-08-27 ENCOUNTER — Encounter: Payer: Self-pay | Admitting: *Deleted

## 2012-08-27 LAB — PSA: PSA: 0.7 ng/mL (ref 0.0–4.0)

## 2012-09-17 ENCOUNTER — Other Ambulatory Visit: Payer: Self-pay | Admitting: Family Medicine

## 2012-10-17 ENCOUNTER — Encounter: Payer: Self-pay | Admitting: Gastroenterology

## 2012-11-15 ENCOUNTER — Other Ambulatory Visit: Payer: Self-pay | Admitting: Family Medicine

## 2012-11-18 ENCOUNTER — Ambulatory Visit (AMBULATORY_SURGERY_CENTER): Payer: BC Managed Care – PPO | Admitting: *Deleted

## 2012-11-18 VITALS — Ht 70.0 in | Wt 261.2 lb

## 2012-11-18 DIAGNOSIS — Z1211 Encounter for screening for malignant neoplasm of colon: Secondary | ICD-10-CM

## 2012-11-18 MED ORDER — NA SULFATE-K SULFATE-MG SULF 17.5-3.13-1.6 GM/177ML PO SOLN: ORAL | Status: DC

## 2012-11-30 HISTORY — PX: COLONOSCOPY: SHX174

## 2012-12-02 ENCOUNTER — Ambulatory Visit (AMBULATORY_SURGERY_CENTER): Payer: BC Managed Care – PPO | Admitting: Gastroenterology

## 2012-12-02 ENCOUNTER — Encounter: Payer: Self-pay | Admitting: Gastroenterology

## 2012-12-02 VITALS — BP 109/64 | HR 57 | Temp 97.0°F | Resp 14 | Ht 70.0 in | Wt 261.0 lb

## 2012-12-02 DIAGNOSIS — Z1211 Encounter for screening for malignant neoplasm of colon: Secondary | ICD-10-CM

## 2012-12-02 DIAGNOSIS — D126 Benign neoplasm of colon, unspecified: Secondary | ICD-10-CM

## 2012-12-02 MED ORDER — SODIUM CHLORIDE 0.9 % IV SOLN: 500.0000 mL | INTRAVENOUS | Status: DC

## 2012-12-02 NOTE — Patient Instructions (Signed)
YOU HAD AN ENDOSCOPIC PROCEDURE TODAY AT THE [facility]: Refer to the procedure report that was given to you for any specific questions about what was found during the examination.  If the procedure report does not answer your questions, please call your gastroenterologist to clarify.  If you requested that your care partner not be given the details of your procedure findings, then the procedure report has been included in a sealed envelope for you to review at your convenience later.  YOU SHOULD EXPECT: Some feelings of bloating in the abdomen. Passage of more gas than usual.  Walking can help get rid of the air that was put into your GI tract during the procedure and reduce the bloating. If you had a lower endoscopy (such as a colonoscopy or flexible sigmoidoscopy) you may notice spotting of blood in your stool or on the toilet paper. If you underwent a bowel prep for your procedure, then you may not have a normal bowel movement for a few days.  DIET: Your first meal following the procedure should be a light meal and then it is ok to progress to your normal diet.  A half-sandwich or bowl of soup is an example of a good first meal.  Heavy or fried foods are harder to digest and may make you feel nauseous or bloated.  Likewise meals heavy in dairy and vegetables can cause extra gas to form and this can also increase the bloating.  Drink plenty of fluids but you should avoid alcoholic beverages for 24 hours.  ACTIVITY: Your care partner should take you home directly after the procedure.  You should plan to take it easy, moving slowly for the rest of the day.  You can resume normal activity the day after the procedure however you should NOT DRIVE or use heavy machinery for 24 hours (because of the sedation medicines used during the test).    SYMPTOMS TO REPORT IMMEDIATELY: A gastroenterologist can be reached at any hour.  During normal business hours, 8:30 AM to 5:00 PM Monday through Friday,  call [phone].  After hours and on weekends, please call the GI answering service at [phone] who will take a message and have the physician on call contact you.   Following lower endoscopy (colonoscopy or flexible sigmoidoscopy):  Excessive amounts of blood in the stool  Significant tenderness or worsening of abdominal pains  Swelling of the abdomen that is new, acute  Fever of 100F or higher    FOLLOW UP: If any biopsies were taken you will be contacted by phone or by letter within the next 1-3 weeks.  Call your gastroenterologist if you have not heard about the biopsies in 3 weeks.  Our staff will call the home number listed on your records the next business day following your procedure to check on you and address any questions or concerns that you may have at that time regarding the information given to you following your procedure. This is a courtesy call and so if there is no answer at the home number and we have not heard from you through the emergency physician on call, we will assume that you have returned to your regular daily activities without incident.  SIGNATURES/CONFIDENTIALITY: You and/or your care partner have signed paperwork which will be entered into your electronic medical record.  These signatures attest to the fact that that the information above on your After Visit Summary has been reviewed and is understood.  Full responsibility of the confidentiality   of this discharge information lies with you and/or your care-partner.     

## 2012-12-02 NOTE — Op Note (Signed)
[facility] [address] [place], [address]   COLONOSCOPY PROCEDURE REPORT  PATIENT: Gabriel [name], Gabriel [name]  MR#: [id number] BIRTHDATE: 1961-02-04 , 51  yrs. old GENDER: Male ENDOSCOPIST: Rachael Fee, MD REFERRED XB:JYNWGN Gutierrez, M.D. PROCEDURE DATE:  12/02/2012 PROCEDURE:   Colonoscopy with snare polypectomy ASA CLASS:   Class II INDICATIONS:average risk screening. MEDICATIONS: Fentanyl 75 mcg IV, Versed 8 mg IV, and These medications were titrated to patient response per physician's verbal order  DESCRIPTION OF PROCEDURE:   After the risks benefits and alternatives of the procedure were thoroughly explained, informed consent was obtained.  A digital rectal exam revealed no abnormalities of the rectum.   The LB [id number]  endoscope was introduced through the anus and advanced to the cecum, which was identified by both the appendix and ileocecal valve. No adverse events experienced.   The quality of the prep was good, using MoviPrep  The instrument was then slowly withdrawn as the colon was fully examined.  COLON FINDINGS: Two small polyps were found, removed and both were sent to pathology.  These were both sessile, 3-80mm across, removed with cold snare, one was in ascending segment and the other in rectum.  There were a few small scattered diverticulum throughout the colon.  The examination was otherwise normal.  Retroflexed views revealed no abnormalities. The time to cecum=3 minutes 38 seconds.  Withdrawal time=12 minutes 08 seconds.  The scope was withdrawn and the procedure completed. COMPLICATIONS: There were no complications.  ENDOSCOPIC IMPRESSION: Two small polyps were found, removed and both were sent to pathology. There were a few small scattered diverticulum throughout the colon.  The examination was otherwise normal.  RECOMMENDATIONS: If the polyp(s) removed today are proven to be adenomatous (pre-cancerous) polyps, you  will need a repeat colonoscopy in 5 years.  Otherwise you should continue to follow colorectal cancer screening guidelines for "routine risk" patients with colonoscopy in 10 years.  You will receive a letter within 1-2 weeks with the results of your biopsy as well as final recommendations.  Please call my office if you have not received a letter after 3 weeks.   eSigned:  Rachael Fee, MD 12/02/2012 9:29 AM

## 2012-12-02 NOTE — Progress Notes (Signed)
Patient did not experience any of the following events: a burn prior to discharge; a fall within the facility; wrong site/side/patient/procedure/implant event; or a hospital transfer or hospital admission upon discharge from the facility. (G8907) Patient did not have preoperative order for IV antibiotic SSI prophylaxis. (G8918)  

## 2012-12-03 ENCOUNTER — Telehealth: Payer: Self-pay | Admitting: *Deleted

## 2012-12-03 NOTE — Telephone Encounter (Signed)
  Follow up Call-  Call back number 12/02/2012  Post procedure Call Back phone  # [phone]  Permission to leave phone message Yes     Patient questions:  Do you have a fever, pain , or abdominal swelling? no Pain Score  0 *  Have you tolerated food without any problems? yes  Have you been able to return to your normal activities? yes  Do you have any questions about your discharge instructions: Diet   no Medications  no Follow up visit  no  Do you have questions or concerns about your Care? no  Actions: * If pain score is 4 or above: No action needed, pain <4.

## 2012-12-05 ENCOUNTER — Encounter: Payer: Self-pay | Admitting: Family Medicine

## 2012-12-09 ENCOUNTER — Encounter: Payer: Self-pay | Admitting: Gastroenterology

## 2013-01-08 ENCOUNTER — Other Ambulatory Visit: Payer: Self-pay | Admitting: Family Medicine

## 2013-02-28 ENCOUNTER — Encounter: Payer: Self-pay | Admitting: Family Medicine

## 2013-02-28 ENCOUNTER — Ambulatory Visit (INDEPENDENT_AMBULATORY_CARE_PROVIDER_SITE_OTHER): Payer: BC Managed Care – PPO | Admitting: Family Medicine

## 2013-02-28 VITALS — BP 128/82 | HR 72 | Temp 98.1°F | Wt 264.8 lb

## 2013-02-28 DIAGNOSIS — J029 Acute pharyngitis, unspecified: Secondary | ICD-10-CM | POA: Insufficient documentation

## 2013-02-28 MED ORDER — AMOXICILLIN-POT CLAVULANATE 875-125 MG PO TABS: 1.0000 | ORAL_TABLET | Freq: Two times a day (BID) | ORAL | Status: AC

## 2013-02-28 NOTE — Patient Instructions (Signed)
I think you have viral pharyngitis or sore throat associated with sinus headache. Let's give this some time - if improving then don't fill antibiotic. If worsening or persisting symptoms past mid next week, fill augmentin. Continue other meds including tylenol, nasal saline, nasal steroid and nasal antihistamine and guaifenesin.

## 2013-02-28 NOTE — Assessment & Plan Note (Signed)
With headache. I'm not convinced this is sinusitis. Anticipate more viral pharyngitis with sinus pressure headache. Recommended continued treatment as up to now, monitor for improvement off abx. Given hx, provided with WASP for augmentin in case worsening or not improved into next week. Discussed normal viral course (7-10 days). Pt agrees with plan.

## 2013-02-28 NOTE — Progress Notes (Signed)
  Subjective:    Patient ID: Gabriel [name], male    DOB: May 16, 1961, 52 y.o.   MRN: [id number]  HPI CC: sinus sxs?  3d h/o sore throat that has progressed to facial pain. Last night especially bad.  + frontal headache.  Purulent discharge when coughing.  + ear pain.  productive cough present, worse at night.  + chronic PNDrainage.  Has tried tylenol for this.  Using chloraseptic spray.   No fevers/chills.abd pain, nausea.  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  No sick contacts at home. No smokers at home.  Takes nasal steroid and antihistamine in the morning.  Uses guaifenesin as needed.  Allergies have been pretty stable over the last year.  Past Medical History  Diagnosis Date  . GERD (gastroesophageal reflux disease)   . Hypertension   . Obesity   . 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 (Sprehe ophtho)  . Prediabetes 08/26/2012  . Allergy     Past Surgical History  Procedure Laterality Date  . Antral window/turbinectomy  1992    sinus surgery  . Ett  2007    [facility], [facility]) r/o presumed 2/2 NSAIDs  . Esophagogastroduodenoscopy  2009    gastritis, esophagitis, candida by culture (Dr. Christella Hartigan)  . Colonoscopy  11/2012    adenomatous and hyperplastic polyps, few diverticuli Christella Hartigan) rpt 5 yrs    Review of Systems Per HPI    Objective:   Physical Exam  Nursing note and vitals reviewed. Constitutional: He appears well-developed and well-nourished. No distress.  HENT:  Head: Normocephalic and atraumatic.  Right Ear: Hearing, tympanic membrane, external ear and ear canal normal.  Left Ear: Hearing, tympanic membrane, external ear and ear canal normal.  Nose: Mucosal edema present. No rhinorrhea. Right sinus exhibits frontal sinus tenderness. Right sinus exhibits no maxillary sinus  tenderness. Left sinus exhibits frontal sinus tenderness. Left sinus exhibits no maxillary sinus tenderness.  Mouth/Throat: Uvula is midline, oropharynx is clear and moist and mucous membranes are normal. No oropharyngeal exudate, posterior oropharyngeal edema, posterior oropharyngeal erythema or tonsillar abscesses.  Eyes: Conjunctivae and EOM are normal. Pupils are equal, round, and reactive to light. No scleral icterus.  Neck: Normal range of motion. Neck supple.  Cardiovascular: Normal rate, regular rhythm, normal heart sounds and intact distal pulses.   No murmur heard. Pulmonary/Chest: Effort normal and breath sounds normal. No respiratory distress. He has no wheezes. He has no rales.  Musculoskeletal: He exhibits no edema.  Lymphadenopathy:    He has no cervical adenopathy.  Skin: Skin is warm and dry. No rash noted.       Assessment & Plan:

## 2013-03-23 ENCOUNTER — Other Ambulatory Visit: Payer: Self-pay | Admitting: Family Medicine

## 2013-04-17 ENCOUNTER — Other Ambulatory Visit: Payer: Self-pay | Admitting: Family Medicine

## 2013-07-04 ENCOUNTER — Other Ambulatory Visit: Payer: Self-pay | Admitting: Family Medicine

## 2013-08-25 ENCOUNTER — Ambulatory Visit (INDEPENDENT_AMBULATORY_CARE_PROVIDER_SITE_OTHER): Payer: BC Managed Care – PPO | Admitting: Family Medicine

## 2013-08-25 ENCOUNTER — Encounter: Payer: Self-pay | Admitting: Family Medicine

## 2013-08-25 VITALS — BP 126/78 | HR 75 | Temp 98.1°F | Wt 274.8 lb

## 2013-08-25 DIAGNOSIS — J329 Chronic sinusitis, unspecified: Secondary | ICD-10-CM

## 2013-08-25 MED ORDER — MOMETASONE FUROATE 50 MCG/ACT NA SUSP: NASAL | Status: DC

## 2013-08-25 MED ORDER — AMOXICILLIN-POT CLAVULANATE 875-125 MG PO TABS: 1.0000 | ORAL_TABLET | Freq: Two times a day (BID) | ORAL | Status: AC

## 2013-08-25 NOTE — Progress Notes (Signed)
Pre-visit discussion using our clinic review tool. No additional management support is needed unless otherwise documented below in the visit note.  

## 2013-08-25 NOTE — Patient Instructions (Signed)
For sinus infection - treat with augmentin twice daily for 10 days. May continue OTC cough syrup as needed. Continue guaifenesin with plenty of water to mobilize mucous. Continue other meds as up to now.

## 2013-08-25 NOTE — Assessment & Plan Note (Signed)
sxs consistent with rpt sinus infection - treat with 10d course augmentin. Update if sxs deteriorate or fail to improve. Pt agrees with plan.

## 2013-08-25 NOTE — Progress Notes (Signed)
  Subjective:    Patient ID: Gabriel [name], male    DOB: [birth year], 52 y.o.   MRN: [id number]  HPI CC: sinus infection?  sxs started around 08/09/2013 - persistent cough especially at night time, PNdrainage.  This weekend was especially bad - significant purulent drainage, left sided frontal pressure headache.  Has been using nasonex, astelin, claritin, decongestants.  Also using guaifenesin.  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   Past Medical History  Diagnosis Date  . GERD (gastroesophageal reflux disease)   . Hypertension   . Obesity   . 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 (Sprehe ophtho)  . Prediabetes 08/26/2012  . Allergy     Review of Systems Per HPI    Objective:   Physical Exam  Nursing note and vitals reviewed. Constitutional: He appears well-developed and well-nourished. No distress.  HENT:  Head: Normocephalic and atraumatic.  Right Ear: Hearing, tympanic membrane, external ear and ear canal normal.  Left Ear: Hearing, tympanic membrane, external ear and ear canal normal.  Nose: Mucosal edema present. No rhinorrhea. Right sinus exhibits no maxillary sinus tenderness and no frontal sinus tenderness. Left sinus exhibits frontal sinus tenderness. Left sinus exhibits no maxillary sinus tenderness.  Mouth/Throat: Uvula is midline. Posterior oropharyngeal erythema present. No oropharyngeal exudate, posterior oropharyngeal edema or tonsillar abscesses.  White PNdrainage/discharge  Eyes: Conjunctivae and EOM are normal. Pupils are equal, round, and reactive to light. No scleral icterus.  Neck: Normal range of motion. Neck supple.  Cardiovascular: Normal rate, regular rhythm, normal heart sounds and intact distal pulses.   No murmur heard. Pulmonary/Chest: Effort  normal and breath sounds normal. No respiratory distress. He has no wheezes. He has no rales.  Lymphadenopathy:    He has no cervical adenopathy.  Skin: Skin is warm and dry. No rash noted.       Assessment & Plan:

## 2013-09-20 ENCOUNTER — Other Ambulatory Visit: Payer: Self-pay | Admitting: Family Medicine

## 2013-09-30 ENCOUNTER — Other Ambulatory Visit: Payer: Self-pay | Admitting: Family Medicine

## 2013-10-02 LAB — HM DIABETES EYE EXAM

## 2013-10-07 ENCOUNTER — Encounter: Payer: Self-pay | Admitting: Internal Medicine

## 2013-10-07 ENCOUNTER — Ambulatory Visit (INDEPENDENT_AMBULATORY_CARE_PROVIDER_SITE_OTHER): Payer: BC Managed Care – PPO | Admitting: Internal Medicine

## 2013-10-07 VITALS — BP 128/82 | HR 77 | Temp 98.0°F | Wt 275.5 lb

## 2013-10-07 DIAGNOSIS — J329 Chronic sinusitis, unspecified: Secondary | ICD-10-CM

## 2013-10-07 MED ORDER — CEFDINIR 300 MG PO CAPS: 300.0000 mg | ORAL_CAPSULE | Freq: Two times a day (BID) | ORAL | Status: DC

## 2013-10-07 NOTE — Progress Notes (Signed)
Pre-visit discussion using our clinic review tool. No additional management support is needed unless otherwise documented below in the visit note.  

## 2013-10-07 NOTE — Patient Instructions (Signed)

## 2013-10-07 NOTE — Progress Notes (Signed)
HPI  Pt presents to the clinic today with c/o sinusitis. This started about 3 days ago. He c/o post nasal drip, ear pain and sore throat. He also c/o left frontal facial pain. He has taken the Nasonex, astelin, zyrtec and quaifenasen without much relief. He has a history or recurrent sinusitis and scleritis. He was last treated for a sinus infection 08/2013.   Review of Systems    Past Medical History  Diagnosis Date  . GERD (gastroesophageal reflux disease)   . Hypertension   . Obesity   . 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 (Sprehe ophtho)  . Prediabetes 08/26/2012  . Allergy     Family History  Problem Relation Age of Onset  . Hypertension Mother   . Cataracts Mother   . Heart disease Mother     valvular disease  . Heart disease Maternal Grandfather     coronary artery disease.  . Colon cancer Neg Hx   . Stomach cancer Neg Hx     History   Social History  . Marital Status: Married    Spouse Name: N/A    Number of Children: 1  . Years of Education: N/A   Occupational History  . 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 Smokeless tobacco: Never Used  . Alcohol Use: 0.5 oz/week    1 drink(s) per week     Comment: occassional wine  . Drug Use: No  . Sexual Activity: Yes   Other Topics Concern  . Not on file   Social History Narrative  . No narrative on file    Allergies  Allergen Reactions  . Azithromycin     REACTION: rash  . Ketek [Telithromycin]     REACTION: rash  . Nsaids     REACTION: GASTRITIS  . Erythromycin Rash     Constitutional: Positive headache, fatigue =. Denies fever or abrupt weight changes.  HEENT:  Positive eye pain, pressure behind the eyes, facial pain, nasal congestion and sore throat. Denies eye  redness, ear pain, ringing in the ears, wax buildup, runny nose or bloody nose. Respiratory: Positive cough and thick green sputum production. Denies difficulty breathing or shortness of breath.  Cardiovascular: Denies chest pain, chest tightness, palpitations or swelling in the hands or feet.   No other specific complaints in a complete review of systems (except as listed in HPI above).  Objective:    BP 128/82  Pulse 77  Temp(Src) 98 F (36.7 C) (Oral)  Wt 275 lb 8 oz (124.966 kg)  SpO2 98% Wt Readings from Last 3 Encounters:  10/07/13 275 lb 8 oz (124.966 kg)  08/25/13 274 lb 12 oz (124.626 kg)  02/28/13 264 lb 12 oz (120.09 kg)    General: Appears his stated age, well developed, well nourished in NAD. HEENT: Head: normal shape and size, left frontal tenderness; Eyes: sclera white, no icterus, conjunctiva pink, PERRLA and EOMs intact; Ears: Tm's red but intact, distorted light reflex; Nose: mucosa pink and moist, septum midline; Throat/Mouth: + PND. Teeth present, mucosa pink and moist, no exudate noted, no lesions or ulcerations noted.  Neck: Mild cervical lymphadenopathy. Neck supple, trachea midline. No massses, lumps or thyromegaly present.  Cardiovascular: Normal rate and rhythm. S1,S2 noted.  No murmur,  rubs or gallops noted. No JVD or BLE edema. No carotid bruits noted. Pulmonary/Chest: Normal effort and positive vesicular breath sounds. No respiratory distress. No wheezes, rales or ronchi noted.      Assessment & Plan:   Acute bacterial sinusitis  Can use a Neti Pot which can be purchased from your local drug store. eRx for Omnicef BID x 10 days  RTC as needed or if symptoms persist.

## 2013-10-17 ENCOUNTER — Ambulatory Visit: Payer: BC Managed Care – PPO | Admitting: Family Medicine

## 2013-10-17 ENCOUNTER — Encounter: Payer: Self-pay | Admitting: Internal Medicine

## 2013-10-17 ENCOUNTER — Ambulatory Visit (INDEPENDENT_AMBULATORY_CARE_PROVIDER_SITE_OTHER): Payer: BC Managed Care – PPO | Admitting: Internal Medicine

## 2013-10-17 VITALS — BP 130/86 | HR 87 | Temp 98.5°F | Wt 276.5 lb

## 2013-10-17 DIAGNOSIS — M7710 Lateral epicondylitis, unspecified elbow: Secondary | ICD-10-CM

## 2013-10-17 DIAGNOSIS — M7711 Lateral epicondylitis, right elbow: Secondary | ICD-10-CM

## 2013-10-17 DIAGNOSIS — J029 Acute pharyngitis, unspecified: Secondary | ICD-10-CM

## 2013-10-17 NOTE — Progress Notes (Signed)
Pre-visit discussion using our clinic review tool. No additional management support is needed unless otherwise documented below in the visit note.  

## 2013-10-17 NOTE — Progress Notes (Signed)
HPI  Pt presents to the clinic today with c/o sore throat and cough. This started about 1 week ago. He was on Omnicef for a sinus infection. The cough is unproductive but he has noticed chills. He denies difficulty swallowing but does report that it feels swollen. He has not taken anything OTC. He has taken his other medications as prescribed.  Additionally, he c/o right elbow pain. This started a few weeks ago. He describes it as an achy feeling. It can be sharp pain depending on the movement of his elbow. He does report doing repetative motions at work. He has not taken anything OTC for the pain.  Review of Systems      Past Medical History  Diagnosis Date  . GERD (gastroesophageal reflux disease)   . Hypertension   . Obesity   . 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 (Sprehe ophtho)  . Prediabetes 08/26/2012  . Allergy     Family History  Problem Relation Age of Onset  . Hypertension Mother   . Cataracts Mother   . Heart disease Mother     valvular disease  . Heart disease Maternal Grandfather     coronary artery disease.  . Colon cancer Neg Hx   . Stomach cancer Neg Hx     History   Social History  . Marital Status: Married    Spouse Name: N/A    Number of Children: 1  . Years of Education: N/A   Occupational History  . 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 Smokeless tobacco: Never Used  . Alcohol Use: 0.5 oz/week    1 drink(s) per week     Comment: occassional wine  . Drug Use: No  . Sexual Activity: Yes   Other Topics Concern  . Not on file   Social History Narrative  . No narrative on file    Allergies  Allergen Reactions  . Azithromycin     REACTION: rash  . Ketek [Telithromycin]     REACTION: rash  . Nsaids    REACTION: GASTRITIS  . Erythromycin Rash     Constitutional: . Denies headache, fatigue, fever or abrupt weight changes.  HEENT:  Positive sore throat. Denies eye redness, eye pain, pressure behind the eyes, facial pain, nasal congestion, ear pain, ringing in the ears, wax buildup, runny nose or bloody nose. Respiratory: Positive cough. Denies difficulty breathing or shortness of breath.  Cardiovascular: Denies chest pain, chest tightness, palpitations or swelling in the hands or feet.  MSK: Pt reports right elbow pain. He denies joint swelling or redness.  No other specific complaints in a complete review of systems (except as listed in HPI above).  Objective:   BP 130/86  Pulse 87  Temp(Src) 98.5 F (36.9 C) (Oral)  Wt 276 lb 8 oz (125.42 kg)  SpO2 98% Wt Readings from Last 3 Encounters:  10/17/13 276 lb 8 oz (125.42 kg)  10/07/13 275 lb 8 oz (124.966 kg)  08/25/13 274 lb 12 oz (124.626 kg)     General: Appears his stated age, well developed, well nourished in NAD. HEENT: Head: normal shape and size; Eyes: sclera white, no icterus, conjunctiva pink, PERRLA and EOMs intact; Ears: Tm's gray and intact, normal light reflex; Nose: mucosa  pink and moist, septum midline; Throat/Mouth: + PND. Teeth present, mucosa erythematous and moist, no exudate noted, no lesions or ulcerations noted.  Neck: Neck supple, trachea midline. No massses, lumps or thyromegaly present.  Cardiovascular: Normal rate and rhythm. S1,S2 noted.  No murmur, rubs or gallops noted. No JVD or BLE edema. No carotid bruits noted. Pulmonary/Chest: Normal effort and positive vesicular breath sounds. No respiratory distress. No wheezes, rales or ronchi noted.  MSK: pinpoint pain with palpation of the lateral epicondyle. Normal flexion and extension of the elbow.    Assessment & Plan:   Acute Pharyngitis:  Get some rest and drink plenty of water Do salt water gargles for the sore throat Advised pt this is likely  coming from the sinus drainage Advised him to take 80 mg Depo IM- he declines today He will not take NSAIDS Advised him to take Claritin QHS in addition to Zyrtec in am  Tennis Elbow, right:  Advised pt to get a Tennis Elbow strap to decrease pain Advised him to stop any repetative motions if he can He declined the 80 mg Depo injection today  RTC as needed or if symptoms persist.

## 2013-10-17 NOTE — Patient Instructions (Signed)
Pharyngitis °Pharyngitis is redness, pain, and swelling (inflammation) of your pharynx.  °CAUSES  °Pharyngitis is usually caused by infection. Most of the time, these infections are from viruses (viral) and are part of a cold. However, sometimes pharyngitis is caused by bacteria (bacterial). Pharyngitis can also be caused by allergies. Viral pharyngitis may be spread from person to person by coughing, sneezing, and personal items or utensils (cups, forks, spoons, toothbrushes). Bacterial pharyngitis may be spread from person to person by more intimate contact, such as kissing.  °SIGNS AND SYMPTOMS  °Symptoms of pharyngitis include:   °· Sore throat.   °· Tiredness (fatigue).   °· Low-grade fever.   °· Headache. °· Joint pain and muscle aches. °· Skin rashes. °· Swollen lymph nodes. °· Plaque-like film on throat or tonsils (often seen with bacterial pharyngitis). °DIAGNOSIS  °Your health care provider will ask you questions about your illness and your symptoms. Your medical history, along with a physical exam, is often all that is needed to diagnose pharyngitis. Sometimes, a rapid strep test is done. Other lab tests may also be done, depending on the suspected cause.  °TREATMENT  °Viral pharyngitis will usually get better in 3 4 days without the use of medicine. Bacterial pharyngitis is treated with medicines that kill germs (antibiotics).  °HOME CARE INSTRUCTIONS  °· Drink enough water and fluids to keep your urine clear or pale yellow.   °· Only take over-the-counter or prescription medicines as directed by your health care provider:   °· If you are prescribed antibiotics, make sure you finish them even if you start to feel better.   °· Do not take aspirin.   °· Get lots of rest.   °· Gargle with 8 oz of salt water (½ tsp of salt per 1 qt of water) as often as every 1 2 hours to soothe your throat.   °· Throat lozenges (if you are not at risk for choking) or sprays may be used to soothe your throat. °SEEK MEDICAL  CARE IF:  °· You have large, tender lumps in your neck. °· You have a rash. °· You cough up green, yellow-brown, or bloody spit. °SEEK IMMEDIATE MEDICAL CARE IF:  °· Your neck becomes stiff. °· You drool or are unable to swallow liquids. °· You vomit or are unable to keep medicines or liquids down. °· You have severe pain that does not go away with the use of recommended medicines. °· You have trouble breathing (not caused by a stuffy nose). °MAKE SURE YOU:  °· Understand these instructions. °· Will watch your condition. °· Will get help right away if you are not doing well or get worse. °Document Released: 09/18/2005 Document Revised: 07/09/2013 Document Reviewed: 05/26/2013 °ExitCare® Patient Information ©2014 [employer]. ° °

## 2013-11-20 ENCOUNTER — Ambulatory Visit (INDEPENDENT_AMBULATORY_CARE_PROVIDER_SITE_OTHER): Payer: BC Managed Care – PPO | Admitting: Family Medicine

## 2013-11-20 ENCOUNTER — Encounter: Payer: Self-pay | Admitting: Family Medicine

## 2013-11-20 VITALS — BP 150/88 | HR 60 | Temp 97.6°F | Wt 275.5 lb

## 2013-11-20 DIAGNOSIS — H9202 Otalgia, left ear: Secondary | ICD-10-CM | POA: Insufficient documentation

## 2013-11-20 DIAGNOSIS — E669 Obesity, unspecified: Secondary | ICD-10-CM

## 2013-11-20 DIAGNOSIS — H9209 Otalgia, unspecified ear: Secondary | ICD-10-CM

## 2013-11-20 MED ORDER — AMOXICILLIN 875 MG PO TABS: 875.0000 mg | ORAL_TABLET | Freq: Two times a day (BID) | ORAL | Status: DC

## 2013-11-20 NOTE — Patient Instructions (Addendum)
Left eardrum is irritated but no infection today. Treat with continued nasonex, start tylenol for discomfort, drink 64 oz water if able. If fever or worsening sinus pressure, may fill antibiotic but this should improve with time. Look into mediterranean diet Return in 1 mo for follow up weight and blood pressure.  DASH Diet The DASH diet stands for "Dietary Approaches to Stop Hypertension." It is a healthy eating plan that has been shown to reduce high blood pressure (hypertension) in as little as 14 days, while also possibly providing other significant health benefits. These other health benefits include reducing the risk of breast cancer after menopause and reducing the risk of type 2 diabetes, heart disease, colon cancer, and stroke. Health benefits also include weight loss and slowing kidney failure in patients with chronic kidney disease.  DIET GUIDELINES  Limit salt (sodium). Your diet should contain less than 1500 mg of sodium daily.  Limit refined or processed carbohydrates. Your diet should include mostly whole grains. Desserts and added sugars should be used sparingly.  Include small amounts of heart-healthy fats. These types of fats include nuts, oils, and tub margarine. Limit saturated and trans fats. These fats have been shown to be harmful in the body. CHOOSING FOODS  The following food groups are based on a 2000 calorie diet. See your Registered Dietitian for individual calorie needs. Grains and Grain Products (6 to 8 servings daily)  Eat More Often: Whole-wheat bread, brown rice, whole-grain or wheat pasta, quinoa, popcorn without added fat or salt (air popped).  Eat Less Often: White bread, white pasta, white rice, cornbread. Vegetables (4 to 5 servings daily)  Eat More Often: Fresh, frozen, and canned vegetables. Vegetables may be raw, steamed, roasted, or grilled with a minimal amount of fat.  Eat Less Often/Avoid: Creamed or fried vegetables. Vegetables in a cheese  sauce. Fruit (4 to 5 servings daily)  Eat More Often: All fresh, canned (in natural juice), or frozen fruits. Dried fruits without added sugar. One hundred percent fruit juice ( cup [237 mL] daily).  Eat Less Often: Dried fruits with added sugar. Canned fruit in light or heavy syrup. [employer], Fish, and Poultry (2 servings or less daily. One serving is 3 to 4 oz [85-114 g]).  Eat More Often: Ninety percent or leaner ground beef, tenderloin, sirloin. Round cuts of beef, chicken breast, [place] breast. All fish. Grill, bake, or broil your meat. Nothing should be fried.  Eat Less Often/Avoid: Fatty cuts of meat, [place], or chicken leg, thigh, or wing. Fried cuts of meat or fish. Dairy (2 to 3 servings)  Eat More Often: Low-fat or fat-free milk, low-fat plain or light yogurt, reduced-fat or part-skim cheese.  Eat Less Often/Avoid: Milk (whole, 2%).Whole milk yogurt. Full-fat cheeses. Nuts, Seeds, and Legumes (4 to 5 servings per week)  Eat More Often: All without added salt.  Eat Less Often/Avoid: Salted nuts and seeds, canned beans with added salt. Fats and Sweets (limited)  Eat More Often: Vegetable oils, tub margarines without trans fats, sugar-free gelatin. Mayonnaise and salad dressings.  Eat Less Often/Avoid: Coconut oils, palm oils, butter, stick margarine, cream, half and half, cookies, candy, pie. FOR MORE INFORMATION The Dash Diet Eating Plan: www.dashdiet.org Document Released: 09/07/2011 Document Revised: 12/11/2011 Document Reviewed: 09/07/2011 [facility] Patient Information 2014 [place], [place].

## 2013-11-20 NOTE — Progress Notes (Signed)
BP 150/88  Pulse 60  Temp(Src) 97.6 F (36.4 C) (Oral)  Wt 275 lb 8 oz (124.966 kg)   CC: L ear itching  Subjective:    Patient ID: Gabriel [name], male    DOB: November 15, 1960, 53 y.o.   MRN: [id number]  HPI: Gabriel [name] is a 53 y.o. male presenting on 11/20/2013 with Otalgia  Seen here twice last month, first with recurrent sinusitis and ear infection treated with omnicef then with pharyngitis treated with steroid shot.    Now over last 2-3 days having L earache, and today R ear started hurting.  + PNDrainage.  L ear staying achey and itchy. Tried OTC decongestant.  Today restarted guaifenesin Compliant with astelin, zyrtec and nasonex. Next week to go to [place] road trip.  Oh by the way..Significant trouble overeating.  Asks about appetite suppressant.  Has tried nutrisystem, [employer].  Frustrated with inability to lose weight. Wt Readings from Last 3 Encounters:  11/20/13 275 lb 8 oz (124.966 kg)  10/17/13 276 lb 8 oz (125.42 kg)  10/07/13 275 lb 8 oz (124.966 kg)  Body mass index is 39.53 kg/(m^2). BP Readings from Last 3 Encounters:  11/20/13 150/88  10/17/13 130/86  10/07/13 128/82    Relevant past medical, surgical, family and social history reviewed and updated. Allergies and medications reviewed and updated. Current Outpatient Prescriptions on File Prior to Visit  Medication Sig  . amLODipine (NORVASC) 5 MG tablet Take 1 tablet at bedtime **MUST HAVE PHYSICAL FOR FURTHER REFILLS**  . azelastine (ASTELIN) 137 MCG/SPRAY nasal spray Place 1 spray into the nose every morning. Use in each nostril as directed  . cetirizine (ZYRTEC) 10 MG tablet Take 10 mg by mouth daily as needed.   . Coenzyme Q10 (COQ-10) 200 MG CAPS Take by mouth daily.    Marland Kitchen guaifenesin (HUMIBID E) 400 MG TABS Take 400 mg by mouth as needed.  . lansoprazole (PREVACID) 30 MG capsule Take 30 mg by mouth daily.    Marland Kitchen lisinopril (PRINIVIL,ZESTRIL) 20 MG tablet ONE TAB BY MOUTH AT NIGHT   . mometasone (NASONEX) 50 MCG/ACT nasal spray USE AS DIRECTED  . Magnesium 250 MG TABS Take 1 tablet by mouth daily.  . Misc Natural Products (OSTEO BI-FLEX ADV JOINT SHIELD PO) Take by mouth daily.   No current facility-administered medications on file prior to visit.    Review of Systems Per HPI unless specifically indicated above    Objective:    BP 150/88  Pulse 60  Temp(Src) 97.6 F (36.4 C) (Oral)  Wt 275 lb 8 oz (124.966 kg)  Physical Exam  Nursing note and vitals reviewed. Constitutional: He appears well-developed and well-nourished. No distress.  HENT:  Head: Normocephalic and atraumatic.  Right Ear: Hearing, tympanic membrane, external ear and ear canal normal.  Left Ear: Hearing, external ear and ear canal normal.  Nose: Mucosal edema present. No rhinorrhea. Right sinus exhibits no maxillary sinus tenderness and no frontal sinus tenderness. Left sinus exhibits no maxillary sinus tenderness and no frontal sinus tenderness.  Mouth/Throat: Uvula is midline, oropharynx is clear and moist and mucous membranes are normal. No oropharyngeal exudate, posterior oropharyngeal edema, posterior oropharyngeal erythema or tonsillar abscesses.  L TM injected but good light reflex and preserved landmarks, no fluid behind or bulging of TM Nasal mucosal irritation/inflammation L>R  Eyes: Conjunctivae and EOM are normal. Pupils are equal, round, and reactive to light. No scleral icterus.  Neck: Normal range of motion. Neck supple.  Cardiovascular: Normal rate,  regular rhythm, normal heart sounds and intact distal pulses.   No murmur heard. Pulmonary/Chest: Effort normal and breath sounds normal. No respiratory distress. He has no wheezes. He has no rales.  Lymphadenopathy:    He has no cervical adenopathy.  Skin: Skin is warm and dry. No rash noted.       Assessment & Plan:   Problem List Items Addressed This Visit   Earache, left - Primary     Exam with L injected TM but no  evidence for AOM. Will treat with tylenol and restart nasonex. Provided with WASP for amox given h/o recurrent sinusitis with instructions on when to fill given upcoming trip. Advised likely will improve with time.    Obesity     Briefly discussed importance of incorporating regular exercise into routine. Discussed mediterranean diet and provided DASH diet handout. rtc 1 mo for f/u.        Follow up plan: Return in about 4 weeks (around 12/18/2013), or if symptoms worsen or fail to improve, for follow up.

## 2013-11-20 NOTE — Progress Notes (Signed)
Pre visit review using our clinic review tool, if applicable. No additional management support is needed unless otherwise documented below in the visit note. 

## 2013-11-20 NOTE — Assessment & Plan Note (Signed)
Briefly discussed importance of incorporating regular exercise into routine. Discussed mediterranean diet and provided DASH diet handout. rtc 1 mo for f/u.

## 2013-11-20 NOTE — Assessment & Plan Note (Signed)
Exam with L injected TM but no evidence for AOM. Will treat with tylenol and restart nasonex. Provided with WASP for amox given h/o recurrent sinusitis with instructions on when to fill given upcoming trip. Advised likely will improve with time.

## 2013-12-20 ENCOUNTER — Other Ambulatory Visit: Payer: Self-pay | Admitting: Family Medicine

## 2013-12-22 NOTE — Telephone Encounter (Signed)
Ok to refill 42mos per Dr Darnell Level

## 2014-03-03 ENCOUNTER — Other Ambulatory Visit: Payer: Self-pay | Admitting: Family Medicine

## 2014-03-20 ENCOUNTER — Other Ambulatory Visit: Payer: Self-pay | Admitting: Family Medicine

## 2014-03-30 ENCOUNTER — Ambulatory Visit (INDEPENDENT_AMBULATORY_CARE_PROVIDER_SITE_OTHER): Payer: BC Managed Care – PPO | Admitting: Family Medicine

## 2014-03-30 ENCOUNTER — Encounter: Payer: Self-pay | Admitting: Family Medicine

## 2014-03-30 VITALS — BP 150/90 | HR 64 | Temp 98.2°F | Wt 274.2 lb

## 2014-03-30 DIAGNOSIS — K219 Gastro-esophageal reflux disease without esophagitis: Secondary | ICD-10-CM | POA: Insufficient documentation

## 2014-03-30 DIAGNOSIS — I10 Essential (primary) hypertension: Secondary | ICD-10-CM

## 2014-03-30 DIAGNOSIS — K2100 Gastro-esophageal reflux disease with esophagitis, without bleeding: Secondary | ICD-10-CM

## 2014-03-30 DIAGNOSIS — J309 Allergic rhinitis, unspecified: Secondary | ICD-10-CM | POA: Insufficient documentation

## 2014-03-30 DIAGNOSIS — Z125 Encounter for screening for malignant neoplasm of prostate: Secondary | ICD-10-CM

## 2014-03-30 DIAGNOSIS — R7309 Other abnormal glucose: Secondary | ICD-10-CM

## 2014-03-30 DIAGNOSIS — R7303 Prediabetes: Secondary | ICD-10-CM

## 2014-03-30 DIAGNOSIS — J329 Chronic sinusitis, unspecified: Secondary | ICD-10-CM

## 2014-03-30 MED ORDER — AMOXICILLIN 875 MG PO TABS: 875.0000 mg | ORAL_TABLET | Freq: Two times a day (BID) | ORAL | Status: DC

## 2014-03-30 MED ORDER — MONTELUKAST SODIUM 10 MG PO TABS: 10.0000 mg | ORAL_TABLET | Freq: Every day | ORAL | Status: DC

## 2014-03-30 MED ORDER — OMEPRAZOLE 40 MG PO CPDR: 40.0000 mg | DELAYED_RELEASE_CAPSULE | Freq: Every day | ORAL | Status: DC

## 2014-03-30 MED ORDER — AMLODIPINE BESYLATE 5 MG PO TABS: ORAL_TABLET | ORAL | Status: DC

## 2014-03-30 NOTE — Assessment & Plan Note (Addendum)
sxs recurring over last several months - has been on lansoprazole long term. Will trial omeprazole along with zantac or pepcid at night. Update me if sxs persist. H/o gastritis, esophagitis and candida on last EGD 2009

## 2014-03-30 NOTE — Progress Notes (Signed)
BP 150/90  Pulse 64  Temp(Src) 98.2 F (36.8 C) (Oral)  Wt 274 lb 4 oz (124.399 kg)   CC: sinusitis?  Subjective:    Patient ID: Gabriel [name], male    DOB: Mar 05, 1961, 53 y.o.   MRN: [id number]  HPI: Gabriel [name] is a 53 y.o. male presenting on 03/30/2014 for Sinusitis   Pt upset about not having received enough reminder to schedule physical, and then amlodipine was not approved for him due to need for f/u.  1 wk h/o left frontal facial pain, drainage down back of throat, cough productive of mucous.   No fevers/chills, abd pain or nausea.  No sick contacts at home. So far has tried mucinex. Avoids decongestants 2/2 h/o gastritis. Taking astelin, zyrtec and nasonex - regular allergic rhinitis regimen.  HTN - ran out of amlodipine. O/w bp has been running well. BP Readings from Last 3 Encounters:  03/30/14 150/90  11/20/13 150/88  10/17/13 130/86    GERD - prevacid 30mg  daily.  EGD 2009 with gastritis and esophagitis. Endorses recently worsening GERD sxs. ESOPHAGOGASTRODUODENOSCOPY Date: 2009 gastritis, esophagitis, candida by culture (Dr. Ardis Hughs)  Wt Readings from Last 3 Encounters:  03/30/14 274 lb 4 oz (124.399 kg)  11/20/13 275 lb 8 oz (124.966 kg)  10/17/13 276 lb 8 oz (125.42 kg)  Body mass index is 39.35 kg/(m^2).  Past Medical History  Diagnosis Date  . GERD (gastroesophageal reflux disease)   . Hypertension   . Obesity   . 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 (Sprehe ophtho)  . Prediabetes 08/26/2012  . Allergic rhinitis      Relevant past medical, surgical, family and social history reviewed and updated as indicated.  Allergies and medications reviewed and updated. Current Outpatient Prescriptions on File Prior to Visit  Medication Sig  . azelastine (ASTELIN) 137 MCG/SPRAY nasal  spray Place 1 spray into the nose every morning. Use in each nostril as directed  . cetirizine (ZYRTEC) 10 MG tablet Take 10 mg by mouth daily as needed.   . Coenzyme Q10 (COQ-10) 200 MG CAPS Take by mouth daily.    Marland Kitchen guaifenesin (HUMIBID E) 400 MG TABS Take 400 mg by mouth as needed.  . lansoprazole (PREVACID) 30 MG capsule Take 30 mg by mouth daily.    Marland Kitchen lisinopril (PRINIVIL,ZESTRIL) 20 MG tablet ONE TAB BY MOUTH AT NIGHT  . Magnesium 250 MG TABS Take 1 tablet by mouth daily.  . Misc Natural Products (OSTEO BI-FLEX ADV JOINT SHIELD PO) Take by mouth daily.  . Multiple Vitamin (MULTIVITAMIN) tablet Take 1 tablet by mouth daily.  Marland Kitchen NASONEX 50 MCG/ACT nasal spray USE AS DIRECTED   No current facility-administered medications on file prior to visit.    Review of Systems Per HPI unless specifically indicated above    Objective:    BP 150/90  Pulse 64  Temp(Src) 98.2 F (36.8 C) (Oral)  Wt 274 lb 4 oz (124.399 kg)  Physical Exam  Nursing note and vitals reviewed. Constitutional: He appears well-developed and well-nourished. No distress.  HENT:  Head: Normocephalic and atraumatic.  Right Ear: Hearing, tympanic membrane, external ear and ear canal normal.  Left Ear: Hearing, tympanic membrane, external ear and ear canal normal.  Nose: Mucosal edema present. No rhinorrhea. Right sinus exhibits no maxillary sinus tenderness and no frontal  sinus tenderness. Left sinus exhibits frontal sinus tenderness. Left sinus exhibits no maxillary sinus tenderness.  Mouth/Throat: Uvula is midline, oropharynx is clear and moist and mucous membranes are normal. No oropharyngeal exudate, posterior oropharyngeal edema, posterior oropharyngeal erythema or tonsillar abscesses.  PNdrainage back of throat  Eyes: Conjunctivae and EOM are normal. Pupils are equal, round, and reactive to light. No scleral icterus.  Neck: Normal range of motion. Neck supple.  Cardiovascular: Normal rate, regular rhythm, normal  heart sounds and intact distal pulses.   No murmur heard. Pulmonary/Chest: Effort normal and breath sounds normal. No respiratory distress. He has no wheezes. He has no rales.  Lymphadenopathy:    He has no cervical adenopathy.  Skin: Skin is warm and dry. No rash noted.       Assessment & Plan:   Problem List Items Addressed This Visit   Recurrent sinusitis - Primary     Concern for developing sinusitis. Treat with 10d amoxicillin course. WASP script provided - he still has prior abx script from months ago.    Relevant Medications      amoxicillin (AMOXIL) tablet   HYPERTENSION, BENIGN ESSENTIAL     Refilled amlodipine. Pt will schedule CPE. Advised I would work towards more regular reminders for scheduling CPE and also encouraged pt to take more active role in his healthcare to help monitor when he is due for his physicals.    Relevant Medications      amLODIpine (NORVASC) tablet   GERD (gastroesophageal reflux disease)     sxs recurring over last several months - has been on lansoprazole long term. Will trial omeprazole along with zantac or pepcid at night. Update me if sxs persist. H/o gastritis, esophagitis and candida on last EGD 2009    Relevant Medications      omeprazole (PRILOSEC) capsule   Allergic rhinitis     Reviewed current regimen which includes astelin, nasonex, and zyrtec. Pt endorses continued allergic rhinitis congestion so will add singulair to regimen. Consider allergy referral.        Follow up plan: Return as needed.

## 2014-03-30 NOTE — Assessment & Plan Note (Signed)
Concern for developing sinusitis. Treat with 10d amoxicillin course. WASP script provided - he still has prior abx script from months ago.

## 2014-03-30 NOTE — Addendum Note (Signed)
Addended by: Ria Bush on: 03/30/2014 11:36 PM   Modules accepted: Orders

## 2014-03-30 NOTE — Assessment & Plan Note (Signed)
Reviewed current regimen which includes astelin, nasonex, and zyrtec. Pt endorses continued allergic rhinitis congestion so will add singulair to regimen. Consider allergy referral.

## 2014-03-30 NOTE — Patient Instructions (Addendum)
On your way out schedule physical at your convenience. Return prior fasting for blood work. Let's fill prescription for amoxicillin 10 day course.  Then let's do trial of singulair nightly for allergies and congestion. Do 1 month trial of this. If no better, would stop. Continue other allergy regimen as up to now. Restart amlodipine, continue lisinopril.

## 2014-03-30 NOTE — Assessment & Plan Note (Signed)
Refilled amlodipine. Pt will schedule CPE. Advised I would work towards more regular reminders for scheduling CPE and also encouraged pt to take more active role in his healthcare to help monitor when he is due for his physicals.

## 2014-03-30 NOTE — Progress Notes (Signed)
Pre visit review using our clinic review tool, if applicable. No additional management support is needed unless otherwise documented below in the visit note. 

## 2014-03-31 NOTE — Addendum Note (Signed)
Addended by: Ria Bush on: 03/31/2014 12:07 AM   Modules accepted: Orders

## 2014-03-31 NOTE — Addendum Note (Signed)
Addended by: Ellamae Sia on: 03/31/2014 10:46 AM   Modules accepted: Orders

## 2014-04-06 ENCOUNTER — Telehealth: Payer: Self-pay

## 2014-04-06 MED ORDER — HYDROCODONE-HOMATROPINE 5-1.5 MG/5ML PO SYRP: 5.0000 mL | ORAL_SOLUTION | Freq: Two times a day (BID) | ORAL | Status: DC | PRN

## 2014-04-06 NOTE — Telephone Encounter (Signed)
Attempted to call patient. No answer and no voicemail. Will try again later.

## 2014-04-06 NOTE — Telephone Encounter (Signed)
Pt was recently seen on 03/30/14 with sinus infection, pt has sinus drainage at back of throat and Delsym is not helping non prod cough. Pt not able to rest at night due to cough. No fever,wheezing or SOB.Pt request cough med sent to [address].Please advise.

## 2014-04-06 NOTE — Telephone Encounter (Signed)
I don't know if pt has had good response to tessalon perls in past - if willing to try this, may phone in 100mg  tessalon perls 1 bid prn cough #30 RF 0. O/w pt will need to pick up script for hydrocodone cough syrup - placed in Kim's box. If decides on hydrocodone cough syrup plz advise of sedation precautions.

## 2014-04-07 MED ORDER — BENZONATATE 100 MG PO CAPS: 100.0000 mg | ORAL_CAPSULE | Freq: Two times a day (BID) | ORAL | Status: DC | PRN

## 2014-04-07 NOTE — Telephone Encounter (Signed)
Spoke with patient. He was willing to try the tessalon perls. Will call back if no help. Sent into pharmacy as requested. Hydrocodone cough syrup script shredded.

## 2014-04-08 ENCOUNTER — Other Ambulatory Visit: Payer: Self-pay

## 2014-04-08 NOTE — Telephone Encounter (Signed)
Pt left v/m; tessalon is not helping cough; pt could not sleep last night and pt request stronger cough med due to aggressive drainage. Pt request cb when rx ready for pick up. According to 04/06/14 note hydrocodone homatropine rx was shredded.Please advise.

## 2014-04-09 MED ORDER — HYDROCODONE-HOMATROPINE 5-1.5 MG/5ML PO SYRP: 5.0000 mL | ORAL_SOLUTION | Freq: Two times a day (BID) | ORAL | Status: DC | PRN

## 2014-04-09 NOTE — Telephone Encounter (Signed)
Patient notified and Rx placed up front for pick up. 

## 2014-04-09 NOTE — Telephone Encounter (Signed)
I will reprint script when I arrive to clinic today.

## 2014-04-09 NOTE — Telephone Encounter (Signed)
Printed and placed in Kim's box. 

## 2014-05-14 ENCOUNTER — Encounter: Payer: Self-pay | Admitting: Gastroenterology

## 2014-05-22 LAB — COMPREHENSIVE METABOLIC PANEL
A/G RATIO: 1.6 (ref 1.1–2.5)
ALBUMIN: 4.2 g/dL (ref 3.5–5.5)
ALT: 23 IU/L (ref 0–44)
AST: 16 IU/L (ref 0–40)
Alkaline Phosphatase: 94 IU/L (ref 39–117)
BILIRUBIN TOTAL: 0.3 mg/dL (ref 0.0–1.2)
BUN/Creatinine Ratio: 14 (ref 9–20)
BUN: 17 mg/dL (ref 6–24)
CALCIUM: 9.6 mg/dL (ref 8.7–10.2)
CO2: 21 mmol/L (ref 18–29)
Chloride: 101 mmol/L (ref 97–108)
Creatinine, Ser: 1.21 mg/dL (ref 0.76–1.27)
GFR, EST AFRICAN AMERICAN: 79 mL/min/{1.73_m2} (ref >59)
GFR, EST NON AFRICAN AMERICAN: 68 mL/min/{1.73_m2} (ref >59)
Globulin, Total: 2.6 g/dL (ref 1.5–4.5)
Glucose: 139 mg/dL — ABNORMAL HIGH (ref 65–99)
Potassium: 4.4 mmol/L (ref 3.5–5.2)
Sodium: 139 mmol/L (ref 134–144)
Total Protein: 6.8 g/dL (ref 6.0–8.5)

## 2014-05-22 LAB — LIPID PANEL
CHOLESTEROL TOTAL: 216 mg/dL — AB (ref 100–199)
Chol/HDL Ratio: 6 ratio units — ABNORMAL HIGH (ref 0.0–5.0)
HDL: 36 mg/dL — AB (ref >39)
LDL CALC: 157 mg/dL — AB (ref 0–99)
TRIGLYCERIDES: 114 mg/dL (ref 0–149)
VLDL Cholesterol Cal: 23 mg/dL (ref 5–40)

## 2014-05-22 LAB — HEMOGLOBIN A1C
Est. average glucose Bld gHb Est-mCnc: 163 mg/dL
Hgb A1c MFr Bld: 7.3 % — ABNORMAL HIGH (ref 4.8–5.6)

## 2014-05-22 LAB — PSA: PSA: 0.4 ng/mL (ref 0.0–4.0)

## 2014-06-04 ENCOUNTER — Encounter: Payer: Self-pay | Admitting: Family Medicine

## 2014-06-04 ENCOUNTER — Ambulatory Visit (INDEPENDENT_AMBULATORY_CARE_PROVIDER_SITE_OTHER): Payer: BC Managed Care – PPO | Admitting: Family Medicine

## 2014-06-04 VITALS — BP 130/74 | HR 64 | Temp 98.1°F | Ht 71.0 in | Wt 272.2 lb

## 2014-06-04 DIAGNOSIS — Z23 Encounter for immunization: Secondary | ICD-10-CM

## 2014-06-04 DIAGNOSIS — E7800 Pure hypercholesterolemia, unspecified: Secondary | ICD-10-CM

## 2014-06-04 DIAGNOSIS — IMO0002 Reserved for concepts with insufficient information to code with codable children: Secondary | ICD-10-CM

## 2014-06-04 DIAGNOSIS — E1165 Type 2 diabetes mellitus with hyperglycemia: Secondary | ICD-10-CM

## 2014-06-04 DIAGNOSIS — I10 Essential (primary) hypertension: Secondary | ICD-10-CM

## 2014-06-04 DIAGNOSIS — IMO0001 Reserved for inherently not codable concepts without codable children: Secondary | ICD-10-CM

## 2014-06-04 DIAGNOSIS — Z0000 Encounter for general adult medical examination without abnormal findings: Secondary | ICD-10-CM

## 2014-06-04 DIAGNOSIS — E669 Obesity, unspecified: Secondary | ICD-10-CM

## 2014-06-04 NOTE — Progress Notes (Signed)
BP 130/74  Pulse 64  Temp(Src) 98.1 F (36.7 C) (Oral)  Ht 5\' 11"  (1.803 m)  Wt 272 lb 4 oz (123.492 kg)  BMI 37.99 kg/m2   CC: CPE  Subjective:    Patient ID: Gabriel [name], male    DOB: [birth year], 53 y.o.   MRN: [id number]  HPI: Gabriel [name] is a 53 y.o. male presenting on 06/04/2014 for Annual Exam   Wt Readings from Last 3 Encounters:  06/04/14 272 lb 4 oz (123.492 kg)  03/30/14 274 lb 4 oz (124.399 kg)  11/20/13 275 lb 8 oz (124.966 kg)   Body mass index is 37.99 kg/(m^2).  Preventative: Prostate exam normal at that time. Discussed. Will continue to screen.  COLONOSCOPY Date: 11/2012 adenomatous and hyperplastic polyps, few diverticuli Ardis Hughs) rpt 5 yrs Declines flu shot.  Td 10/2002. Tdap today.  Seat belt use discussed.  Sunscreen use discussed.   Lives with wife, no pets Occupation: Forensic psychologist for [employer] Activity: no regular exercise Diet: good water, fruits/vegetables daily  Relevant past medical, surgical, family and social history reviewed and updated as indicated.  Allergies and medications reviewed and updated. Current Outpatient Prescriptions on File Prior to Visit  Medication Sig  . amLODipine (NORVASC) 5 MG tablet TAKE 1 TABLET BY MOUTH AT BEDTIME  . azelastine (ASTELIN) 137 MCG/SPRAY nasal spray Place 1 spray into the nose every morning. Use in each nostril as directed  . cetirizine (ZYRTEC) 10 MG tablet Take 10 mg by mouth daily as needed.   . Coenzyme Q10 (COQ-10) 200 MG CAPS Take by mouth daily.    Marland Kitchen guaifenesin (HUMIBID E) 400 MG TABS Take 400 mg by mouth as needed.  Marland Kitchen lisinopril (PRINIVIL,ZESTRIL) 20 MG tablet ONE TAB BY MOUTH AT NIGHT  . MAGNESIUM PO Take 1 tablet by mouth 2 (two) times a week.  . Multiple Vitamin (MULTIVITAMIN) tablet Take 1 tablet by mouth daily.  . Multiple Vitamins-Minerals (ZINC PO) Take 1 tablet by mouth 2 (two) times a week.  Marland Kitchen NASONEX 50 MCG/ACT nasal spray USE AS DIRECTED  .  omeprazole (PRILOSEC) 40 MG capsule Take 1 capsule (40 mg total) by mouth daily.   No current facility-administered medications on file prior to visit.    Review of Systems  Constitutional: Negative for fever, chills, activity change, appetite change, fatigue and unexpected weight change.  HENT: Negative for hearing loss.   Eyes: Negative for visual disturbance.  Respiratory: Negative for cough, chest tightness, shortness of breath and wheezing.   Cardiovascular: Negative for chest pain, palpitations and leg swelling.  Gastrointestinal: Negative for nausea, vomiting, abdominal pain, diarrhea, constipation, blood in stool and abdominal distention.  Genitourinary: Negative for hematuria and difficulty urinating.  Musculoskeletal: Negative for arthralgias, myalgias and neck pain.  Skin: Negative for rash.  Neurological: Positive for dizziness. Negative for seizures, syncope and headaches.  Hematological: Negative for adenopathy. Does not bruise/bleed easily.  Psychiatric/Behavioral: Negative for dysphoric mood. The patient is not nervous/anxious.    Per HPI unless specifically indicated above    Objective:    BP 130/74  Pulse 64  Temp(Src) 98.1 F (36.7 C) (Oral)  Ht 5\' 11"  (1.803 m)  Wt 272 lb 4 oz (123.492 kg)  BMI 37.99 kg/m2  Physical Exam  Nursing note and vitals reviewed. Constitutional: He is oriented to person, place, and time. He appears well-developed and well-nourished. No distress.  HENT:  Head: Normocephalic and atraumatic.  Right Ear: Hearing, tympanic membrane, external ear and ear canal  normal.  Left Ear: Hearing, tympanic membrane, external ear and ear canal normal.  Nose: Nose normal.  Mouth/Throat: Uvula is midline, oropharynx is clear and moist and mucous membranes are normal. No oropharyngeal exudate, posterior oropharyngeal edema or posterior oropharyngeal erythema.  Eyes: Conjunctivae and EOM are normal. Pupils are equal, round, and reactive to light. No  scleral icterus.  Neck: Normal range of motion. Neck supple. No thyromegaly present.  Cardiovascular: Normal rate, regular rhythm, normal heart sounds and intact distal pulses.   No murmur heard. Pulses:      Radial pulses are 2+ on the right side, and 2+ on the left side.  Pulmonary/Chest: Effort normal and breath sounds normal. No respiratory distress. He has no wheezes. He has no rales.  Abdominal: Soft. Bowel sounds are normal. He exhibits no distension and no mass. There is no tenderness. There is no rebound and no guarding.  Musculoskeletal: Normal range of motion. He exhibits no edema.  Lymphadenopathy:    He has no cervical adenopathy.  Neurological: He is alert and oriented to person, place, and time.  CN grossly intact, station and gait intact  Skin: Skin is warm and dry. No rash noted.  Psychiatric: He has a normal mood and affect. His behavior is normal. Judgment and thought content normal.   Results for orders placed in visit on 03/30/14  COMPREHENSIVE METABOLIC PANEL      Result Value Ref Range   Glucose 139 (*) 65 - 99 mg/dL   BUN 17  6 - 24 mg/dL   Creatinine, Ser 1.21  0.76 - 1.27 mg/dL   GFR calc non Af Amer 68  >59 mL/min/1.73   GFR calc Af Amer 79  >59 mL/min/1.73   BUN/Creatinine Ratio 14  9 - 20   Sodium 139  134 - 144 mmol/L   Potassium 4.4  3.5 - 5.2 mmol/L   Chloride 101  97 - 108 mmol/L   CO2 21  18 - 29 mmol/L   Calcium 9.6  8.7 - 10.2 mg/dL   Total Protein 6.8  6.0 - 8.5 g/dL   Albumin 4.2  3.5 - 5.5 g/dL   Globulin, Total 2.6  1.5 - 4.5 g/dL   Albumin/Globulin Ratio 1.6  1.1 - 2.5   Total Bilirubin 0.3  0.0 - 1.2 mg/dL   Alkaline Phosphatase 94  39 - 117 IU/L   AST 16  0 - 40 IU/L   ALT 23  0 - 44 IU/L  HEMOGLOBIN A1C      Result Value Ref Range   Hemoglobin A1C 7.3 (*) 4.8 - 5.6 %   Estimated average glucose 163    PSA      Result Value Ref Range   PSA 0.4  0.0 - 4.0 ng/mL  LIPID PANEL      Result Value Ref Range   Cholesterol, Total 216  (*) 100 - 199 mg/dL   Triglycerides 114  0 - 149 mg/dL   HDL 36 (*) >39 mg/dL   VLDL Cholesterol Cal 23  5 - 40 mg/dL   LDL Calculated 157 (*) 0 - 99 mg/dL   Chol/HDL Ratio 6.0 (*) 0.0 - 5.0 ratio units      Assessment & Plan:   Problem List Items Addressed This Visit   Morbid obesity     Reviewed healthy diet and lifestyle changes for weight loss. Body mass index is 37.99 kg/(m^2).     HYPERTENSION, BENIGN ESSENTIAL     Chronic, stable. Continue amlodipine and  lisinopril.    HYPERCHOLESTEROLEMIA, PURE     reviewed #s - discussed tighter goals with DM dx. Pt will work on [place] for next 3 mo then recheck FLP and decide on statin at that time.    Healthcare maintenance - Primary     Preventative protocols reviewed and updated unless pt declined. Discussed healthy diet and lifestyle.    Diabetes type 2, uncontrolled     Reviewed new diabetes diagnosis - pt will work on healthy diet/lifestyle changes and return in 70mo for recheck. Will do foot exam at that visit  Pt already regularly sees eye doctor.        Follow up plan: Return in about 3 months (around 09/03/2014), or as needed, for f/u DM.

## 2014-06-04 NOTE — Assessment & Plan Note (Addendum)
reviewed #s - discussed tighter goals with DM dx. Pt will work on [place] for next 3 mo then recheck FLP and decide on statin at that time.

## 2014-06-04 NOTE — Assessment & Plan Note (Signed)
Preventative protocols reviewed and updated unless pt declined. Discussed healthy diet and lifestyle.  

## 2014-06-04 NOTE — Assessment & Plan Note (Signed)
Chronic, stable. Continue amlodipine and lisinopril.  ?

## 2014-06-04 NOTE — Assessment & Plan Note (Signed)
Reviewed healthy diet and lifestyle changes for weight loss. Body mass index is 37.99 kg/(m^2).

## 2014-06-04 NOTE — Patient Instructions (Signed)
Tdap today.  Work on diet and lifestyle changes as up to now.  Return in 3 months for fasting labwork again and then follow up visit.  Look into restarting biking or some aerobic exercise.  Good to see you today, call us with quesitons.

## 2014-06-04 NOTE — Progress Notes (Signed)
Pre visit review using our clinic review tool, if applicable. No additional management support is needed unless otherwise documented below in the visit note. 

## 2014-06-04 NOTE — Assessment & Plan Note (Signed)
Reviewed new diabetes diagnosis - pt will work on healthy diet/lifestyle changes and return in 17mo for recheck. Will do foot exam at that visit  Pt already regularly sees eye doctor.

## 2014-06-04 NOTE — Addendum Note (Signed)
Addended by: Royann Shivers A on: 06/04/2014 05:15 PM   Modules accepted: Orders

## 2014-07-19 ENCOUNTER — Other Ambulatory Visit: Payer: Self-pay | Admitting: Family Medicine

## 2014-07-26 ENCOUNTER — Other Ambulatory Visit: Payer: Self-pay | Admitting: Family Medicine

## 2014-07-28 ENCOUNTER — Other Ambulatory Visit: Payer: Self-pay | Admitting: Family Medicine

## 2014-08-19 ENCOUNTER — Telehealth: Payer: Self-pay | Admitting: Family Medicine

## 2014-08-19 DIAGNOSIS — I10 Essential (primary) hypertension: Secondary | ICD-10-CM

## 2014-08-19 DIAGNOSIS — E7800 Pure hypercholesterolemia, unspecified: Secondary | ICD-10-CM

## 2014-08-19 DIAGNOSIS — IMO0002 Reserved for concepts with insufficient information to code with codable children: Secondary | ICD-10-CM

## 2014-08-19 DIAGNOSIS — E1165 Type 2 diabetes mellitus with hyperglycemia: Secondary | ICD-10-CM

## 2014-08-19 NOTE — Telephone Encounter (Signed)
Labs ordered, plz notify patient.

## 2014-08-19 NOTE — Telephone Encounter (Signed)
Spoke to pt this morning to reschedule a 3 month DM follow up appointment.  Pt needs orders for labs put into [facility] so he can go to [facility] draw station prior to appointment on 09/03/14.  Thank you / lt

## 2014-08-19 NOTE — Telephone Encounter (Signed)
Patient notified

## 2014-08-31 NOTE — Addendum Note (Signed)
Addended by: Ellamae Sia on: 08/31/2014 08:35 AM   Modules accepted: Orders

## 2014-09-01 LAB — LIPID PANEL
Chol/HDL Ratio: 5.2 ratio units — ABNORMAL HIGH (ref 0.0–5.0)
Cholesterol, Total: 214 mg/dL — ABNORMAL HIGH (ref 100–199)
HDL: 41 mg/dL (ref >39)
LDL Calculated: 150 mg/dL — ABNORMAL HIGH (ref 0–99)
Triglycerides: 114 mg/dL (ref 0–149)
VLDL CHOLESTEROL CAL: 23 mg/dL (ref 5–40)

## 2014-09-01 LAB — BASIC METABOLIC PANEL
BUN / CREAT RATIO: 15 (ref 9–20)
BUN: 16 mg/dL (ref 6–24)
CO2: 23 mmol/L (ref 18–29)
Calcium: 9.8 mg/dL (ref 8.7–10.2)
Chloride: 98 mmol/L (ref 97–108)
Creatinine, Ser: 1.09 mg/dL (ref 0.76–1.27)
GFR calc Af Amer: 89 mL/min/{1.73_m2} (ref >59)
GFR, EST NON AFRICAN AMERICAN: 77 mL/min/{1.73_m2} (ref >59)
Glucose: 123 mg/dL — ABNORMAL HIGH (ref 65–99)
Potassium: 4.4 mmol/L (ref 3.5–5.2)
SODIUM: 138 mmol/L (ref 134–144)

## 2014-09-01 LAB — HEMOGLOBIN A1C
Est. average glucose Bld gHb Est-mCnc: 140 mg/dL
HEMOGLOBIN A1C: 6.5 % — AB (ref 4.8–5.6)

## 2014-09-03 ENCOUNTER — Ambulatory Visit: Payer: BC Managed Care – PPO | Admitting: Family Medicine

## 2014-09-03 ENCOUNTER — Encounter: Payer: Self-pay | Admitting: Family Medicine

## 2014-09-03 ENCOUNTER — Ambulatory Visit (INDEPENDENT_AMBULATORY_CARE_PROVIDER_SITE_OTHER): Payer: BC Managed Care – PPO | Admitting: Family Medicine

## 2014-09-03 VITALS — BP 136/82 | HR 60 | Temp 97.7°F | Wt 267.2 lb

## 2014-09-03 DIAGNOSIS — E785 Hyperlipidemia, unspecified: Secondary | ICD-10-CM

## 2014-09-03 DIAGNOSIS — E119 Type 2 diabetes mellitus without complications: Secondary | ICD-10-CM

## 2014-09-03 DIAGNOSIS — I10 Essential (primary) hypertension: Secondary | ICD-10-CM

## 2014-09-03 NOTE — Progress Notes (Signed)
Pre visit review using our clinic review tool, if applicable. No additional management support is needed unless otherwise documented below in the visit note. 

## 2014-09-03 NOTE — Assessment & Plan Note (Addendum)
Reviewed #s, improved control noted  Congratulated on healthy diet and lifestyle changes - pt feels this is sustainable. Diet controlled. Foot exam today. Declines pneumovax. UTD eye exam.

## 2014-09-03 NOTE — Assessment & Plan Note (Signed)
bp stable. Continue regimen. On ACEI.

## 2014-09-03 NOTE — Addendum Note (Signed)
Addended by: Ria Bush on: 09/03/2014 12:38 PM   Modules accepted: Orders

## 2014-09-03 NOTE — Progress Notes (Signed)
BP 136/82 mmHg  Pulse 60  Temp(Src) 97.7 F (36.5 C) (Oral)  Wt 267 lb 4 oz (121.224 kg)   CC: DM f/u visit  Subjective:    Patient ID: Gabriel Gabriel [name], male    DOB: 1961/09/01, 53 y.o.   MRN: [id number]  HPI: Gabriel Gabriel [name] is a 53 y.o. male presenting on 09/03/2014 for Follow-up   Recent trip to [place], [place]. Trying to make healthier choices.  Recent DM dx - here for f/u DM. Not on meds. Actually, A1c 7.3 --> 6.5%, 5lb weight loss noted.  Diabetic eye exam - 10/2013. Diet changes - Gabriel [name] cognisant of diet, eating higher quality proteins, egg white omeletes, almonds for snacks, decreasing potato chips and eating rice cakes or special K crisps. Intermittent rewards ok. Notices he's a comfort food eater.  Activity changes - started going to gym 3x/wk (30 min cardio, some weight lifting).  Lab Results  Component Value Date   HGBA1C 6.5* 08/31/2014   Declines flu and pneumovax today.  HLD - pt has worked on [employer] for last 3 months. On recheck, FLP relatively unchanged (although HDL increased 5 points to normal range (41)).   Relevant past medical, surgical, family and social history reviewed and updated as indicated. Interim medical history since our last visit reviewed. Allergies and medications reviewed and updated.  Current Outpatient Prescriptions on File Prior to Visit  Medication Sig  . amLODipine (NORVASC) 5 MG tablet TAKE 1 TABLET BY MOUTH AT BEDTIME  . azelastine (ASTELIN) 137 MCG/SPRAY nasal spray Place 1 spray into the nose every morning. Use in each nostril as directed  . cetirizine (ZYRTEC) 10 MG tablet Take 10 mg by mouth daily as needed.   . Coenzyme Q10 (COQ-10) 200 MG CAPS Take by mouth daily.    Marland Kitchen guaifenesin (HUMIBID E) 400 MG TABS Take 400 mg by mouth as needed.  Marland Kitchen lisinopril (PRINIVIL,ZESTRIL) 20 MG tablet ONE TAB BY MOUTH AT NIGHT  . MAGNESIUM PO Take 1 tablet by mouth 2 (two) times a week.  . Multiple Vitamin (MULTIVITAMIN) tablet Take  1 tablet by mouth daily.  . Multiple Vitamins-Minerals (ZINC PO) Take 1 tablet by mouth 2 (two) times a week.  Marland Kitchen NASONEX 50 MCG/ACT nasal spray USE AS DIRECTED  . omeprazole (PRILOSEC) 40 MG capsule TAKE ONE CAPSULE BY MOUTH EVERY DAY   No current facility-administered medications on file prior to visit.   Past Medical History  Diagnosis Date  . GERD (gastroesophageal reflux disease)   . Hypertension   . Obesity   . 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 (Sprehe ophtho)  . Diabetes type 2, uncontrolled 08/26/2012  . Allergic rhinitis     Review of Systems Per HPI unless specifically indicated above     Objective:    BP 136/82 mmHg  Pulse 60  Temp(Src) 97.7 F (36.5 C) (Oral)  Wt 267 lb 4 oz (121.224 kg)  Wt Readings from Last 3 Encounters:  09/03/14 267 lb 4 oz (121.224 kg)  06/04/14 272 lb 4 oz (123.492 kg)  03/30/14 274 lb 4 oz (124.399 kg)    Physical Exam  Constitutional: He appears well-developed and well-nourished. No distress.  HENT:  Head: Normocephalic and atraumatic.  Right Ear: External ear normal.  Left Ear: External ear normal.  Nose: Nose normal.  Mouth/Throat:  Oropharynx is clear and moist. No oropharyngeal exudate.  Eyes: Conjunctivae and EOM are normal. Pupils are equal, round, and reactive to light. No scleral icterus.  Neck: Normal range of motion. Neck supple.  Cardiovascular: Normal rate, regular rhythm, normal heart sounds and intact distal pulses.   No murmur heard. Pulmonary/Chest: Effort normal and breath sounds normal. No respiratory distress. He has no wheezes. He has no rales.  Musculoskeletal: He exhibits no edema.  Diabetic foot exam: Normal inspection No skin breakdown No calluses  Normal DP/PT pulses (?slight decrease on right) Normal sensation to light touch and  monofilament Nails normal  Lymphadenopathy:    He has no cervical adenopathy.  Skin: Skin is warm and dry. No rash noted.  Psychiatric: He has a normal mood and affect.  Nursing note and vitals reviewed.  Results for orders placed or performed in visit on 09/03/14  HM DIABETES EYE EXAM  Result Value Ref Range   HM Diabetic Eye Exam No Retinopathy No Retinopathy      Assessment & Plan:   Problem List Items Addressed This Visit    Morbid obesity    Body mass index is 37.29 kg/(m^2).  Reviewed 5 lb weight loss noted. Encouraged continued healthy diet/lifestyle changes which pt feels is sustainable. Discussed stress eating/comfort eating vs eating for nourishment. Discussed taste preferences are learned.    HYPERTENSION, BENIGN ESSENTIAL    bp stable. Continue regimen. On ACEI.    Dyslipidemia    Reviewed #s - persistently uncontrolled. Discussed reasons to treat cholesterol as well as need for tighter control with diabetes history. Recommended statin. Pt declines.  Agrees to rtc 6 mo FLP recheck.    Diet-controlled diabetes mellitus - Primary    Reviewed #s, improved control noted  Congratulated on healthy diet and lifestyle changes - pt feels this is sustainable. Diet controlled. Foot exam today. Declines pneumovax. UTD eye exam.        Follow up plan: Return in about 6 months (around 03/05/2015), or as needed, for follow up visit.

## 2014-09-03 NOTE — Assessment & Plan Note (Signed)
Reviewed #s - persistently uncontrolled. Discussed reasons to treat cholesterol as well as need for tighter control with diabetes history. Recommended statin. Pt declines.  Agrees to rtc 6 mo FLP recheck.

## 2014-09-03 NOTE — Patient Instructions (Addendum)
Good job with healthy diet/lifestyle changes - keep up the good work. Return as needed or in 6 months for office visit to recheck sugar and cholesterol levels (prior fasting for labs).

## 2014-09-03 NOTE — Assessment & Plan Note (Signed)
Body mass index is 37.29 kg/(m^2).  Reviewed 5 lb weight loss noted. Encouraged continued healthy diet/lifestyle changes which pt feels is sustainable. Discussed stress eating/comfort eating vs eating for nourishment. Discussed taste preferences are learned.

## 2014-10-27 ENCOUNTER — Encounter: Payer: Self-pay | Admitting: Family Medicine

## 2014-10-27 ENCOUNTER — Ambulatory Visit (INDEPENDENT_AMBULATORY_CARE_PROVIDER_SITE_OTHER): Payer: BLUE CROSS/BLUE SHIELD | Admitting: Family Medicine

## 2014-10-27 VITALS — BP 140/80 | HR 68 | Temp 98.2°F | Wt 270.0 lb

## 2014-10-27 DIAGNOSIS — L304 Erythema intertrigo: Secondary | ICD-10-CM

## 2014-10-27 DIAGNOSIS — J329 Chronic sinusitis, unspecified: Secondary | ICD-10-CM

## 2014-10-27 DIAGNOSIS — E119 Type 2 diabetes mellitus without complications: Secondary | ICD-10-CM

## 2014-10-27 MED ORDER — AMOXICILLIN 875 MG PO TABS: 875.0000 mg | ORAL_TABLET | Freq: Two times a day (BID) | ORAL | Status: DC

## 2014-10-27 MED ORDER — GUAIFENESIN-CODEINE 100-10 MG/5ML PO SYRP: 5.0000 mL | ORAL_SOLUTION | Freq: Two times a day (BID) | ORAL | Status: DC | PRN

## 2014-10-27 NOTE — Progress Notes (Signed)
BP 140/80 mmHg  Pulse 68  Temp(Src) 98.2 F (36.8 C) (Oral)  Wt 270 lb (122.471 kg)   CC: sinus congestion  Subjective:    Patient ID: Gabriel [name], male    DOB: [birth year], 54 y.o.   MRN: [id number]  HPI: Gabriel [name] is a 54 y.o. male presenting on 10/27/2014 for Sinusitis   5d h/o sinus congestion with pressure headache, drainage, facial pain. + cough - keeping him up at night time.   So far has taken guaifenesin which isn't really helping. It has relieved pressure.   No fevers/chills, ear and tooth pain.  No smokers at home.  No h/o asthma.   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  DM - doesn't check sugars. Trying to monitor carb intake. Next set of labs will be in 5 months. Continues going to gym.   Noticing pruritis and diaphoresis between thigh skin folds. Self treated with cortisone which helped.   Relevant past medical, surgical, family and social history reviewed and updated as indicated. Interim medical history since our last visit reviewed. Allergies and medications reviewed and updated. Current Outpatient Prescriptions on File Prior to Visit  Medication Sig  . amLODipine (NORVASC) 5 MG tablet TAKE 1 TABLET BY MOUTH AT BEDTIME  . cetirizine (ZYRTEC) 10 MG tablet Take 10 mg by mouth daily as needed.   . Coenzyme Q10 (COQ-10) 200 MG CAPS Take by mouth daily.    Marland Kitchen guaifenesin (HUMIBID E) 400 MG TABS Take 400 mg by mouth as needed.  Marland Kitchen lisinopril (PRINIVIL,ZESTRIL) 20 MG tablet ONE TAB BY MOUTH AT NIGHT  . MAGNESIUM PO Take 1 tablet by mouth 2 (two) times a week.  . Multiple Vitamin (MULTIVITAMIN) tablet Take 1 tablet by mouth daily.  Marland Kitchen omeprazole (PRILOSEC) 40 MG capsule TAKE ONE CAPSULE BY MOUTH EVERY DAY  . azelastine (ASTELIN) 137 MCG/SPRAY nasal spray Place 1 spray into the nose every morning. Use in each nostril as directed (Patient not taking: Reported on 10/27/2014)  . NASONEX 50 MCG/ACT nasal spray USE AS DIRECTED (Patient  not taking: Reported on 10/27/2014)   No current facility-administered medications on file prior to visit.    Review of Systems Per HPI unless specifically indicated above     Objective:    BP 140/80 mmHg  Pulse 68  Temp(Src) 98.2 F (36.8 C) (Oral)  Wt 270 lb (122.471 kg)  Wt Readings from Last 3 Encounters:  10/27/14 270 lb (122.471 kg)  09/03/14 267 lb 4 oz (121.224 kg)  06/04/14 272 lb 4 oz (123.492 kg)    Physical Exam  Constitutional: He appears well-developed and well-nourished. No distress.  HENT:  Head: Normocephalic and atraumatic.  Right Ear: Hearing, tympanic membrane, external ear and ear canal normal.  Left Ear: Hearing, tympanic membrane, external ear and ear canal normal.  Nose: Mucosal edema (injected mucosa) present. No rhinorrhea. Right sinus exhibits no maxillary sinus tenderness and no frontal sinus tenderness. Left sinus exhibits no maxillary sinus tenderness and no frontal sinus tenderness.  Mouth/Throat: Uvula is midline, oropharynx is clear and moist and mucous membranes are normal. No oropharyngeal exudate, posterior oropharyngeal edema, posterior oropharyngeal erythema or tonsillar abscesses.  Eyes: Conjunctivae and EOM are normal. Pupils are equal, round, and reactive to light. No scleral icterus.  Neck: Normal range of motion. Neck supple.  Cardiovascular: Normal rate, regular rhythm, normal heart sounds and intact distal pulses.   No murmur heard. Pulmonary/Chest: Effort normal and breath sounds normal. No respiratory distress.  He has no wheezes. He has no rales.  Lymphadenopathy:    He has no cervical adenopathy.  Skin: Skin is warm and dry. Rash noted.  Mildly hyperpigmented macular rash with skin abrasion R groin at latera hip skin fold  Nursing note and vitals reviewed.  Results for orders placed or performed in visit on 09/03/14  HM DIABETES EYE EXAM  Result Value Ref Range   HM Diabetic Eye Exam No Retinopathy No Retinopathy        Assessment & Plan:   Problem List Items Addressed This Visit    Recurrent sinusitis - Primary    Acute sinusitis. Discussed viral vs bacterial. I have sent in amoxicillin course for patient to [name] if sxs persistent past 7-10 days or worsening. tussionex has high fructose corn syrup. For this reason, we will try cheratussin cough syrup for night time cough suppression. Continue guaifenesin. Pt agrees with plan.      Relevant Medications   amoxicillin (AMOXIL) tablet   guaiFENesin-codeine (ROBITUSSIN AC) syrup 100-10 mg/59mL   Intertrigo    Early, mild. rec start clotrimazole and advised caution with steroid cream.      Diet-controlled diabetes mellitus    Reviewed dx. Pt does not check sugars, declines glucose meter.          Follow up plan: Return if symptoms worsen or fail to improve.

## 2014-10-27 NOTE — Patient Instructions (Addendum)
You have a sinus infection, may still be viral. Take medicine as prescribed: I've sent amoxicillin to your pharmacy - but give cough syrup a few days to work to see if that is all we will need Push fluids and plenty of rest. Nasal saline irrigation or neti pot to help drain sinuses. May use plain mucinex with plenty of fluid to help mobilize mucous. Please let us know if fever >101.5, trouble opening/closing mouth, difficulty swallowing, or worsening instead of improving as expected. Try lotrimin (clotrimazole) cream for rash.

## 2014-10-27 NOTE — Assessment & Plan Note (Signed)
Early, mild. rec start clotrimazole and advised caution with steroid cream.

## 2014-10-27 NOTE — Progress Notes (Signed)
Pre visit review using our clinic review tool, if applicable. No additional management support is needed unless otherwise documented below in the visit note. 

## 2014-10-27 NOTE — Assessment & Plan Note (Signed)
Reviewed dx. Pt does not check sugars, declines glucose meter.

## 2014-10-27 NOTE — Assessment & Plan Note (Addendum)
Acute sinusitis. Discussed viral vs bacterial. I have sent in amoxicillin course for patient to fill if sxs persistent past 7-10 days or worsening. tussionex has high fructose corn syrup. For this reason, we will try cheratussin cough syrup for night time cough suppression. Continue guaifenesin. Pt agrees with plan.

## 2014-11-12 ENCOUNTER — Other Ambulatory Visit: Payer: Self-pay | Admitting: Family Medicine

## 2014-12-07 ENCOUNTER — Other Ambulatory Visit: Payer: Self-pay | Admitting: Family Medicine

## 2014-12-08 ENCOUNTER — Other Ambulatory Visit: Payer: Self-pay | Admitting: Family Medicine

## 2015-02-23 ENCOUNTER — Ambulatory Visit (INDEPENDENT_AMBULATORY_CARE_PROVIDER_SITE_OTHER): Payer: BLUE CROSS/BLUE SHIELD | Admitting: Family Medicine

## 2015-02-23 ENCOUNTER — Encounter: Payer: Self-pay | Admitting: Family Medicine

## 2015-02-23 VITALS — BP 124/84 | HR 64 | Temp 98.1°F | Wt 268.5 lb

## 2015-02-23 DIAGNOSIS — J329 Chronic sinusitis, unspecified: Secondary | ICD-10-CM

## 2015-02-23 MED ORDER — AMOXICILLIN 875 MG PO TABS: 875.0000 mg | ORAL_TABLET | Freq: Two times a day (BID) | ORAL | Status: DC

## 2015-02-23 NOTE — Assessment & Plan Note (Signed)
Pt has already started treatment - rec finish abx course. Discussed further supportive care.  New emergency WASP provided today.

## 2015-02-23 NOTE — Progress Notes (Signed)
Pre visit review using our clinic review tool, if applicable. No additional management support is needed unless otherwise documented below in the visit note. 

## 2015-02-23 NOTE — Progress Notes (Signed)
BP 124/84 mmHg  Pulse 64  Temp(Src) 98.1 F (36.7 C) (Oral)  Wt 268 lb 8 oz (121.791 kg)  SpO2 99%   CC: nasal congestion/cough  Subjective:    Patient ID: Gabriel [name], male    DOB: 03/01/1961, 54 y.o.   MRN: [id number]  HPI: Gabriel [name] is a 54 y.o. male presenting on 02/23/2015 for Nasal Congestion and Cough   Gabriel [name] presents today with ~5d h/o nasal congestion, associated with productive cough worse at night time, PNdrainage. He did [name] amoxicillin course on Friday x10d. No fevers. Also used halls cough drops.   Prior to this illness had stomach bug.   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  Last sinusitis 10/2014.   Relevant past medical, surgical, family and social history reviewed and updated as indicated. Interim medical history since our last visit reviewed. Allergies and medications reviewed and updated. Current Outpatient Prescriptions on File Prior to Visit  Medication Sig  . amLODipine (NORVASC) 5 MG tablet TAKE 1 TABLET BY MOUTH AT BEDTIME  . azelastine (ASTELIN) 0.1 % nasal spray USE 1 SPRAY IN EACH NOSTRIL EVERY MORNING AS DIRECTED  . cetirizine (ZYRTEC) 10 MG tablet Take 10 mg by mouth daily as needed.   . Coenzyme Q10 (COQ-10) 200 MG CAPS Take by mouth daily.    Marland Kitchen guaifenesin (HUMIBID E) 400 MG TABS Take 400 mg by mouth as needed.  Marland Kitchen lisinopril (PRINIVIL,ZESTRIL) 20 MG tablet TAKE 1 TABLET BY MOUTH AT NIGHT  . MAGNESIUM PO Take 1 tablet by mouth 2 (two) times a week.  . Multiple Vitamin (MULTIVITAMIN) tablet Take 1 tablet by mouth daily.  . Multiple Vitamins-Minerals (ZINC PO) Take 1 tablet by mouth as needed.  Marland Kitchen NASONEX 50 MCG/ACT nasal spray USE AS DIRECTED  . omeprazole (PRILOSEC) 40 MG capsule TAKE ONE CAPSULE BY MOUTH EVERY DAY   No current facility-administered medications on file prior to visit.    Review of Systems Per HPI unless specifically indicated above     Objective:    BP 124/84 mmHg  Pulse 64   Temp(Src) 98.1 F (36.7 C) (Oral)  Wt 268 lb 8 oz (121.791 kg)  SpO2 99%  Wt Readings from Last 3 Encounters:  02/23/15 268 lb 8 oz (121.791 kg)  10/27/14 270 lb (122.471 kg)  09/03/14 267 lb 4 oz (121.224 kg)    Physical Exam  Constitutional: He appears well-developed and well-nourished. No distress.  HENT:  Head: Normocephalic and atraumatic.  Right Ear: Hearing, tympanic membrane, external ear and ear canal normal.  Left Ear: Hearing, tympanic membrane, external ear and ear canal normal.  Nose: Nose normal. No mucosal edema or rhinorrhea. Right sinus exhibits no maxillary sinus tenderness and no frontal sinus tenderness. Left sinus exhibits no maxillary sinus tenderness and no frontal sinus tenderness.  Mouth/Throat: Uvula is midline, oropharynx is clear and moist and mucous membranes are normal. No oropharyngeal exudate, posterior oropharyngeal edema, posterior oropharyngeal erythema or tonsillar abscesses.  Eyes: Conjunctivae and EOM are normal. Pupils are equal, round, and reactive to light. No scleral icterus.  Neck: Normal range of motion. Neck supple.  Cardiovascular: Normal rate, regular rhythm, normal heart sounds and intact distal pulses.   No murmur heard. Pulmonary/Chest: Effort normal and breath sounds normal. No respiratory distress. He has no wheezes. He has no rales.  Lymphadenopathy:    He has no cervical adenopathy.  Skin: Skin is warm and dry. No rash noted.  Nursing note and vitals reviewed.  Results for orders placed or performed in visit on 09/03/14  HM DIABETES EYE EXAM  Result Value Ref Range   HM Diabetic Eye Exam No Retinopathy No Retinopathy      Assessment & Plan:   Problem List Items Addressed This Visit    Recurrent sinusitis - Primary    Pt has already started treatment - rec finish abx course. Discussed further supportive care.  New emergency WASP provided today.      Relevant Medications   amoxicillin (AMOXIL) 875 MG tablet        Follow up plan: Return if symptoms worsen or fail to improve.

## 2015-02-23 NOTE — Patient Instructions (Signed)
You are doing well today. Finish all 10 days of amoxicillin. Refill provided today.

## 2015-05-11 ENCOUNTER — Other Ambulatory Visit: Payer: Self-pay | Admitting: Family Medicine

## 2015-06-01 ENCOUNTER — Other Ambulatory Visit: Payer: Self-pay | Admitting: Family Medicine

## 2015-06-05 ENCOUNTER — Other Ambulatory Visit: Payer: Self-pay | Admitting: Family Medicine

## 2015-06-11 ENCOUNTER — Other Ambulatory Visit: Payer: Self-pay | Admitting: Family Medicine

## 2015-07-01 ENCOUNTER — Ambulatory Visit (INDEPENDENT_AMBULATORY_CARE_PROVIDER_SITE_OTHER): Payer: BLUE CROSS/BLUE SHIELD | Admitting: Internal Medicine

## 2015-07-01 ENCOUNTER — Encounter: Payer: Self-pay | Admitting: Internal Medicine

## 2015-07-01 VITALS — BP 130/80 | HR 60 | Temp 97.8°F | Wt 268.0 lb

## 2015-07-01 DIAGNOSIS — I10 Essential (primary) hypertension: Secondary | ICD-10-CM | POA: Diagnosis not present

## 2015-07-01 NOTE — Assessment & Plan Note (Signed)
BP Readings from Last 3 Encounters:  07/01/15 130/80  02/23/15 124/84  10/27/14 140/80   Repeat 144/82 Has very mild vertigo that sounds like BPPV---reassured about this No sig issue with the BP---reassured Recommended he bring his cuff in for next visit to calibrate

## 2015-07-01 NOTE — Progress Notes (Signed)
Subjective:    Patient ID: Gabriel [name], male    DOB: 15-Nov-1960, 54 y.o.   MRN: [id number]  HPI Here with his wife For several days he has had dizziness (or a week intermittently) Not true vertigo--but feels "weird" when he moves his head Notes it turning in bed Checked his BP during spell 160/88 today  Hasn't been otherwise checking his BP  Did use some tylenol for headache yesterday No real sinus congestion (and he has frequent problems with this)  No chest pain No SOB  Current Outpatient Prescriptions on File Prior to Visit  Medication Sig Dispense Refill  . amLODipine (NORVASC) 5 MG tablet TAKE 1 TABLET BY MOUTH AT BEDTIME 30 tablet 5  . cetirizine (ZYRTEC) 10 MG tablet Take 10 mg by mouth daily as needed.     . Coenzyme Q10 (COQ-10) 200 MG CAPS Take by mouth daily.      Marland Kitchen guaifenesin (HUMIBID E) 400 MG TABS Take 400 mg by mouth as needed.    Marland Kitchen lisinopril (PRINIVIL,ZESTRIL) 20 MG tablet TAKE 1 TABLET BY MOUTH AT NIGHT 30 tablet 3  . Multiple Vitamin (MULTIVITAMIN) tablet Take 1 tablet by mouth daily.    Marland Kitchen omeprazole (PRILOSEC) 40 MG capsule TAKE 1 CAPSULE BY MOUTH EVERY DAY 30 capsule 3   No current facility-administered medications on file prior to visit.    Allergies  Allergen Reactions  . Azithromycin     REACTION: rash  . Ketek [Telithromycin]     REACTION: rash  . Nsaids     REACTION: GASTRITIS  . Erythromycin Rash    Past Medical History  Diagnosis Date  . GERD (gastroesophageal reflux disease)   . Hypertension   . Obesity   . 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 ([place] ophtho)  . Diabetes type 2, uncontrolled 08/26/2012  . Allergic rhinitis     Past Surgical History  Procedure Laterality Date  . Antral window/turbinectomy  1992    sinus surgery  . Ett  2007    WNL, [facility]) r/o presumed 2/2 NSAIDs  . Esophagogastroduodenoscopy  2009    gastritis, esophagitis, candida by culture (Dr. Ardis Hughs)  . Colonoscopy  11/2012    adenomatous and hyperplastic polyps, few diverticuli Ardis Hughs) rpt 5 yrs    Family History  Problem Relation Age of Onset  . Hypertension Mother   . Cataracts Mother   . Heart disease Mother     valvular disease  . CAD Maternal Grandfather     coronary artery disease.  . Colon cancer Neg Hx   . Stomach cancer Neg Hx   . CAD Father     Social History   Social History  . Marital Status: Married    Spouse Name: N/A  . Number of Children: 1  . Years of Education: N/A   Occupational History  . 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 Smokeless tobacco: Never Used  . Alcohol Use: 0.5 oz/week    1 drink(s) per week     Comment: occassional wine  . Drug Use: No  . Sexual Activity: Yes   Other Topics Concern  . Not on file   Social History Narrative   Lives with wife, no pets  Occupation: Forensic psychologist for [employer]   Activity: no regular exercise   Diet: good water, fruits/vegetables daily   Review of Systems Chronic tinnitus Hearing is okay Some headache No fever and doesn't feel sick    Objective:   Physical Exam  Constitutional: He appears well-developed and well-nourished. No distress.  Eyes: Pupils are equal, round, and reactive to light.  No nystagmus  Cardiovascular: Normal rate, regular rhythm and normal heart sounds.  Exam reveals no gallop.   No murmur heard. Pulmonary/Chest: Effort normal and breath sounds normal. No respiratory distress. He has no wheezes. He has no rales.  Musculoskeletal: He exhibits no edema.          Assessment & Plan:

## 2015-07-01 NOTE — Progress Notes (Signed)
Pre visit review using our clinic review tool, if applicable. No additional management support is needed unless otherwise documented below in the visit note. 

## 2015-07-10 ENCOUNTER — Other Ambulatory Visit: Payer: Self-pay | Admitting: Family Medicine

## 2015-07-15 ENCOUNTER — Telehealth: Payer: Self-pay | Admitting: Family Medicine

## 2015-07-15 NOTE — Telephone Encounter (Signed)
Letter mailed notifying patient. 

## 2015-07-15 NOTE — Telephone Encounter (Signed)
plz notify patient (call or letter) Overdue for DM f/u and for physical. Would have him schedule CPE over next few months at his convenience.

## 2015-08-31 ENCOUNTER — Encounter: Payer: Self-pay | Admitting: Internal Medicine

## 2015-08-31 ENCOUNTER — Ambulatory Visit (INDEPENDENT_AMBULATORY_CARE_PROVIDER_SITE_OTHER): Payer: BLUE CROSS/BLUE SHIELD | Admitting: Internal Medicine

## 2015-08-31 VITALS — BP 140/88 | HR 64 | Temp 98.4°F | Wt 269.0 lb

## 2015-08-31 DIAGNOSIS — J0100 Acute maxillary sinusitis, unspecified: Secondary | ICD-10-CM

## 2015-08-31 MED ORDER — AMOXICILLIN 875 MG PO TABS: 875.0000 mg | ORAL_TABLET | Freq: Two times a day (BID) | ORAL | Status: DC

## 2015-08-31 NOTE — Progress Notes (Signed)
HPI  Pt presents to the clinic today with c/o headache, facial pain and pressure, nasal congestion and sore throat. This started 1 week ago. He is blowing yellow blood tinged mucous out of his nose. He denies fever or chills but had had body aches. He has tried Sudafed, Zyrtec, Astelin, Nasonex and Mucinex with some relief. He does have a history of seasonal allergies and has had sinus infections in the past. 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 He has not had sick contacts that he is aware of. He does not take the flu shot. He does not smoke.  Review of Systems    Past Medical History  Diagnosis Date  . GERD (gastroesophageal reflux disease)   . Hypertension   . Obesity   . 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 (Sprehe ophtho)  . Diabetes type 2, uncontrolled ([place]) 08/26/2012  . Allergic rhinitis     Family History  Problem Relation Age of Onset  . Hypertension Mother   . Cataracts Mother   . Heart disease Mother     valvular disease  . CAD Maternal Grandfather     coronary artery disease.  . Colon cancer Neg Hx   . Stomach cancer Neg Hx   . CAD Father     Social History   Social History  . Marital Status: Married    Spouse Name: N/A  . Number of Children: 1  . Years of Education: N/A   Occupational History  . 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 Smokeless tobacco: Never Used  . Alcohol Use: 0.5 oz/week    1 drink(s) per week     Comment: occassional wine  . Drug Use: No  . Sexual Activity: Yes   Other Topics Concern  . Not on file   Social History Narrative   Lives with wife, no pets   Occupation: Forensic psychologist for [employer]   Activity: no regular exercise    Diet: good water, fruits/vegetables daily    Allergies  Allergen Reactions  . Azithromycin     REACTION: rash  . Ketek [Telithromycin]     REACTION: rash  . Nsaids     REACTION: GASTRITIS  . Erythromycin Rash     Constitutional: Positive headache, fatigue. Denies fever or abrupt weight changes.  HEENT:  Positive eye pain, facial pain, nasal congestion and sore throat. Denies eye redness, ear pain, ringing in the ears, wax buildup, runny nose or bloody nose. Respiratory: Denies cough, difficulty breathing or shortness of breath.  Cardiovascular: Denies chest pain, chest tightness, palpitations or swelling in the hands or feet.   No other specific complaints in a complete review of systems (except as listed in HPI above).  Objective:  BP 140/88 mmHg  Pulse 64  Temp(Src) 98.4 F (36.9 C) (Oral)  Wt 269 lb (122.018 kg)  SpO2 98%   General: Appears his stated age,  in NAD. HEENT: Head: normal shape and size, left maxillary sinus tenderness noted; Eyes: sclera white, no icterus, conjunctiva pink; Ears: Tm's oink but intact, normal light reflex, + serous effusion bilaterally; Nose: mucosa pink and moist, septum midline; Throat/Mouth: + PND. Teeth present, mucosa pink and moist, no exudate noted, no  lesions or ulcerations noted.  Neck:  No adenopathy noted.  Cardiovascular: Normal rate and rhythm. S1,S2 noted.  No murmur, rubs or gallops noted.  Pulmonary/Chest: Normal effort and positive vesicular breath sounds. No respiratory distress. No wheezes, rales or ronchi noted.      Assessment & Plan:   Acute bacterial sinusitis  Can use a Neti Pot which can be purchased from your local drug store. Continue Zyrtec, Nasonex and Astelin Amoxil BID for 10 days  RTC as needed or if symptoms persist.

## 2015-08-31 NOTE — Progress Notes (Signed)
Pre visit review using our clinic review tool, if applicable. No additional management support is needed unless otherwise documented below in the visit note. 

## 2015-08-31 NOTE — Patient Instructions (Signed)

## 2015-09-01 ENCOUNTER — Ambulatory Visit
Admission: RE | Admit: 2015-09-01 | Discharge: 2015-09-01 | Disposition: A | Discharge status: Home / Self Care | Payer: BLUE CROSS/BLUE SHIELD | Source: Ambulatory Visit | Attending: Ophthalmology | Admitting: Ophthalmology

## 2015-09-01 ENCOUNTER — Other Ambulatory Visit: Payer: Self-pay | Admitting: Ophthalmology

## 2015-09-01 DIAGNOSIS — H05012 Cellulitis of left orbit: Secondary | ICD-10-CM

## 2015-09-01 DIAGNOSIS — J321 Chronic frontal sinusitis: Secondary | ICD-10-CM | POA: Insufficient documentation

## 2015-09-01 DIAGNOSIS — R938 Abnormal findings on diagnostic imaging of other specified body structures: Secondary | ICD-10-CM | POA: Diagnosis not present

## 2015-09-01 DIAGNOSIS — Z9889 Other specified postprocedural states: Secondary | ICD-10-CM | POA: Diagnosis not present

## 2015-09-01 DIAGNOSIS — R229 Localized swelling, mass and lump, unspecified: Secondary | ICD-10-CM | POA: Diagnosis not present

## 2015-09-01 LAB — POCT I-STAT CREATININE: CREATININE: 0.9 mg/dL (ref 0.61–1.24)

## 2015-09-01 MED ORDER — IOHEXOL 350 MG/ML SOLN
75.0000 mL | Freq: Once | INTRAVENOUS | Status: AC | PRN
  Administered 2015-09-01: 75 mL via INTRAVENOUS

## 2015-09-03 ENCOUNTER — Other Ambulatory Visit: Payer: Self-pay | Admitting: Internal Medicine

## 2015-09-03 ENCOUNTER — Telehealth: Payer: Self-pay

## 2015-09-03 MED ORDER — HYDROCOD POLST-CPM POLST ER 10-8 MG/5ML PO SUER: 5.0000 mL | Freq: Two times a day (BID) | ORAL | Status: DC | PRN

## 2015-09-03 NOTE — Telephone Encounter (Signed)
Pt left v/m; pt seen 08/31/15; pt saw ENT on 09/01/15 and abx is helping but pt having drainage and pt coughing especially at night. Pt not resting at night due to cough. Pt has tried OTC Delsym with no relief from cough at all. In past pt has been given Tussionex for night time cough. Pt request Tussionex. Pt request cb.

## 2015-09-03 NOTE — Telephone Encounter (Signed)
Rx left in front office for pick up and pt is aware  

## 2015-09-03 NOTE — Telephone Encounter (Signed)
RX printed and signed, placed in MYD box

## 2015-10-09 ENCOUNTER — Other Ambulatory Visit: Payer: Self-pay | Admitting: Family Medicine

## 2015-10-12 ENCOUNTER — Other Ambulatory Visit: Payer: Self-pay | Admitting: Family Medicine

## 2015-11-09 ENCOUNTER — Other Ambulatory Visit: Payer: Self-pay | Admitting: Family Medicine

## 2015-11-12 ENCOUNTER — Other Ambulatory Visit: Payer: Self-pay | Admitting: Family Medicine

## 2015-11-15 ENCOUNTER — Other Ambulatory Visit: Payer: Self-pay

## 2015-11-15 ENCOUNTER — Other Ambulatory Visit: Payer: Self-pay | Admitting: Family Medicine

## 2015-11-15 MED ORDER — LISINOPRIL 20 MG PO TABS
ORAL_TABLET | ORAL | Status: DC
Start: 2015-11-15 — End: 2015-12-07

## 2015-11-15 NOTE — Telephone Encounter (Signed)
Pt request refill lisinopril 20 mg; pt states takes one tab in AM (med list has taking at night); pt was notified by [facility] that refill was denied due to pt needing to schedule CPX. Last CPX 06/04/14; saw Dr Silvio Pate 07/01/15 due to BP issue and dizziness. Pt said he cannot be denied his BP med and if this problem is not resolved his next call will be to legal. Advised pt our office is concerned about his care and f/u of hypertension; pt voiced understanding but has financial concerns that is presently paying for testing done due to eye issues. Pt request med refilled to [facility]. And pt request cb ASAP; pt is out of med.

## 2015-11-15 NOTE — Telephone Encounter (Signed)
Sent in #30 with RF6 Seen by Dr Silvio Pate for HTN 06/2015. If he's having financial issues we can work with him on this and space out f/u. But he needs to let us know. Otherwise it is our policy to get him in for office visit. Needs routine labwork to monitor his hypertension and diabetes - none since 08/2014.

## 2015-11-16 NOTE — Telephone Encounter (Signed)
Message left advising patient. Advised to call to schedule labs.

## 2015-12-02 ENCOUNTER — Telehealth: Payer: Self-pay | Admitting: *Deleted

## 2015-12-02 DIAGNOSIS — E119 Type 2 diabetes mellitus without complications: Secondary | ICD-10-CM

## 2015-12-02 DIAGNOSIS — E785 Hyperlipidemia, unspecified: Secondary | ICD-10-CM

## 2015-12-02 DIAGNOSIS — Z79899 Other long term (current) drug therapy: Secondary | ICD-10-CM

## 2015-12-02 MED ORDER — AMLODIPINE BESYLATE 5 MG PO TABS: 5.0000 mg | ORAL_TABLET | Freq: Every day | ORAL | Status: AC

## 2015-12-02 NOTE — Telephone Encounter (Signed)
RF request for amlodipine. Called patient and advised that he needed labs if he couldn't afford appt. He got angry with me and said this was why he was looking to change practices. He said no other practice he has been to has ever made him have labs every year or follow up every year. I explained that anyone that is on a medication needs to have labs at least every year for health reasons. I explained that we weren't trying to make things difficult for him, only trying to make sure he was staying healthy. I said that we understood that he couldn't afford an OV, which is why you were willing to work with him and just get labs on him. He said that he would have labs if he could have them at [facility]. I told him I would put the orders in and he could go at his convenience. Orders placed and Rx refilled.

## 2015-12-03 NOTE — Telephone Encounter (Signed)
will await results.

## 2015-12-07 ENCOUNTER — Other Ambulatory Visit: Payer: Self-pay | Admitting: *Deleted

## 2015-12-07 MED ORDER — OMEPRAZOLE 40 MG PO CPDR: 40.0000 mg | DELAYED_RELEASE_CAPSULE | Freq: Every day | ORAL | Status: AC

## 2015-12-07 MED ORDER — LISINOPRIL 20 MG PO TABS: ORAL_TABLET | ORAL | Status: AC

## 2016-01-26 ENCOUNTER — Telehealth: Payer: Self-pay | Admitting: Family Medicine

## 2016-01-26 NOTE — Telephone Encounter (Signed)
I spoke with pt and on 01/25/16 pt was seen at [facility] in [place] and given proair and tessalon; pt also taking OTC med for cough; meds are not helping. Pt wheezing in airway but lungs are clear.now temp 99.7. Pt had prod cough last night but now non prod cough. Pt does not want to go thru another night like last night; no available appts at [facility] or [place]. Pt did not want to go to [facility] for appt. Pt advised to contact [facility] since just seen there; pt wanted to schedule appt on 01/27/16 or 01/28/16 and the appts offered were not possible due to pts work schedule. Pt said he will contact [facility] and cb to see if cancellations for appt to be seen at [facility]. FYI to Dr Darnell Level.

## 2016-01-26 NOTE — Telephone Encounter (Signed)
Patient Name: Gabriel [name]  DOB: 03/30/1961    Initial Comment Caller states, Dx with upper repertory issue, he was prescribed Pro-air and teslon pearls for a cough. Last night he has a bad cough and fever. The pearls don't work for him. Caller d/c line    Nurse Assessment  Nurse: Mallie Mussel, RN, Alveta Heimlich Date/Time Eilene Ghazi Time): 01/26/2016 2:44:17 PM  Confirm and document reason for call. If symptomatic, describe symptoms. You must click the next button to save text entered. ---Caller states that he was seen and diagnosed with upper respiratory issues. He was prescribed ProAir and Tessalon Pearles. The Tessalon Pearles do not seem to help. He has a fever which began yesterday. Temp is 99 orally. His cough has been productive at times, but today its mostly dry. He denies difficulty breathing.  Has the patient traveled out of the country within the last 30 days? ---No  Does the patient have any new or worsening symptoms? ---Yes  Will a triage be completed? ---Yes  Related visit to physician within the last 2 weeks? ---No  Does the PT have any chronic conditions? (i.e. diabetes, asthma, etc.) ---Yes  List chronic conditions. ---HTN, Pre-Diabetes  Is this a behavioral health or substance abuse call? ---No     Guidelines    Guideline Title Affirmed Question Affirmed Notes  Cough - Acute Non-Productive Cough (all triage questions negative)    Final Disposition User   [place], RN, Alveta Heimlich    Comments  I called the backline and spoke with Morey Hummingbird. she went to get [facility] for me. I did a warm transfer to her.  Wants to try to get something else for his cough.   Disagree/Comply: Disagree  Disagree/Comply Reason: Disagree with instructions

## 2016-01-30 ENCOUNTER — Encounter (HOSPITAL_COMMUNITY): Payer: Self-pay | Admitting: Emergency Medicine

## 2016-01-30 ENCOUNTER — Ambulatory Visit (HOSPITAL_COMMUNITY)
Admission: EM | Admit: 2016-01-30 | Discharge: 2016-01-30 | Disposition: A | Discharge status: Home / Self Care | Payer: BLUE CROSS/BLUE SHIELD | Attending: Emergency Medicine | Admitting: Emergency Medicine

## 2016-01-30 DIAGNOSIS — J018 Other acute sinusitis: Secondary | ICD-10-CM

## 2016-01-30 MED ORDER — PREDNISONE 50 MG PO TABS: ORAL_TABLET | ORAL | Status: AC

## 2016-01-30 MED ORDER — AMOXICILLIN 875 MG PO TABS: 875.0000 mg | ORAL_TABLET | Freq: Two times a day (BID) | ORAL | Status: AC

## 2016-01-30 MED ORDER — HYDROCOD POLST-CPM POLST ER 10-8 MG/5ML PO SUER: 5.0000 mL | Freq: Two times a day (BID) | ORAL | Status: AC | PRN

## 2016-01-30 NOTE — ED Provider Notes (Signed)
CSN: [id number]     Arrival date & time 01/30/16  1300 History   First MD Initiated Contact with Patient 01/30/16 1313     Chief Complaint  Patient presents with  . Facial Pain  . Cough  . Otalgia   (Consider location/radiation/quality/duration/timing/severity/associated sxs/prior Treatment) HPI  Gabriel [name] is a 55 year old man here for evaluation of sinus infection. He states he has a long history of sinus and allergy issues. Several years ago he had a sinus infection that broke through and caused an orbital cellulitis. Since then, they have been very aggressive in treating sinusitis. He states he is taking cetirizine, guaifenesin, and several nasal sprays as his typical regimen.Last week, he developed cold symptoms. Over the last few days he has developed sinus pressure, primarily on the left side associated with earaches. He reports copious postnasal drainage and a slowly worsening cough.No fevers. No eye symptoms.  Past Medical History  Diagnosis Date  . GERD (gastroesophageal reflux disease)   . Hypertension   . Obesity   . 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 ([place] ophtho)  . Diabetes type 2, uncontrolled ([place]) 08/26/2012  . Allergic rhinitis    Past Surgical History  Procedure Laterality Date  . Antral window/turbinectomy  1992    sinus surgery  . Ett  2007    WNL, [facility]) r/o presumed 2/2 NSAIDs  . Esophagogastroduodenoscopy  2009    gastritis, esophagitis, candida by culture (Dr. Ardis Hughs)  . Colonoscopy  11/2012    adenomatous and hyperplastic polyps, few diverticuli Ardis Hughs) rpt 5 yrs   Family History  Problem Relation Age of Onset  . Hypertension Mother   . Cataracts Mother   . Heart disease Mother     valvular disease  . CAD Maternal Grandfather     coronary artery disease.  . Colon cancer Neg  Hx   . Stomach cancer Neg Hx   . CAD Father    Social History  Substance Use Topics  . 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 Smokeless tobacco: Never Used  . Alcohol Use: 0.5 oz/week    1 drink(s) per week     Comment: occassional wine    Review of Systems As in history of present illness Allergies  Azithromycin; Ketek; Nsaids; and Erythromycin  Home Medications   Prior to Admission medications   Medication Sig Start Date End Date Taking? Authorizing Provider  amLODipine (NORVASC) 5 MG tablet Take 1 tablet (5 mg total) by mouth at bedtime. 12/02/15  Yes Ria Bush, MD  azelastine (ASTELIN) 0.1 % nasal spray Place into both nostrils 2 (two) times daily. Use in each nostril as directed   Yes Historical Provider, MD  cetirizine (ZYRTEC) 10 MG tablet Take 10 mg by mouth daily as needed.    Yes Historical Provider, MD  guaifenesin (HUMIBID Gabriel [name]) 400 MG TABS Take 400 mg by mouth as needed.   Yes Historical Provider, MD  lisinopril (PRINIVIL,ZESTRIL) 20 MG tablet Take one tablet by mouth at night 12/07/15  Yes Ria Bush, MD  mometasone (NASONEX) 50 MCG/ACT nasal spray Place 2 sprays into the nose daily.   Yes Historical Provider, MD  omeprazole (PRILOSEC) 40 MG capsule Take 1 capsule (40 mg total) by mouth daily. 12/07/15  Yes Ria Bush, MD  amoxicillin (AMOXIL) 875 MG tablet Take 1 tablet (875 mg  total) by mouth 2 (two) times daily. 01/30/16   Melony Overly, MD  chlorpheniramine-HYDROcodone (TUSSIONEX PENNKINETIC ER) 10-8 MG/5ML SUER Take 5 mLs by mouth every 12 (twelve) hours as needed for cough. 01/30/16   Melony Overly, MD  Coenzyme Q10 (COQ-10) 200 MG CAPS Take by mouth daily.      Historical Provider, MD  Multiple Vitamin (MULTIVITAMIN) tablet Take 1 tablet by mouth daily.    Historical Provider, MD  predniSONE (DELTASONE) 50 MG tablet Take 1 pill daily for 5 days. 01/30/16   Melony Overly, MD   Meds Ordered and Administered this Visit  Medications - No data to display  BP 140/85  mmHg  Pulse 66  Temp(Src) 98.2 F (36.8 C) (Oral)  Resp 18  SpO2 99% No data found.   Physical Exam  Constitutional: He is oriented to person, place, and time. He appears well-developed and well-nourished. No distress.  HENT:  Mouth/Throat: No oropharyngeal exudate.  Moderate postnasal drainage. Nasal mucosa is somewhat inflamed. Left TM is slightly retracted. Right TM is normal.  Neck: Neck supple.  Cardiovascular: Normal rate, regular rhythm and normal heart sounds.   No murmur heard. Pulmonary/Chest: Effort normal. No respiratory distress. He has no wheezes. He has no rales.  Slight wheeze with coughing only  Lymphadenopathy:    He has no cervical adenopathy.  Neurological: He is alert and oriented to person, place, and time.    ED Course  Procedures (including critical care time)  Labs Review Labs Reviewed - No data to display  Imaging Review No results found.   MDM   1. Other acute sinusitis    Continue allergy regimen. Will add amoxicillin as he states this works well for his sinus infections. 5 days of prednisone for the bronchospastic cough. Tussionex as needed for cough. Follow-up as needed.    Melony Overly, MD 01/30/16 [phone]

## 2016-01-30 NOTE — ED Notes (Signed)
The patient presented to the [facility] with a complaint of sinus pain and pressure, a cough, and bilateral ear pain with the left being the worst. The patient stated that he has been having symptoms for 4 days with no relief from OTC meds.

## 2016-01-30 NOTE — Discharge Instructions (Signed)
You have a sinus infection. Take amoxicillin and prednisone as prescribed. Please continue your usual allergy regimen. Use the Tussionex as needed for cough. Follow-up as needed.

## 2016-03-11 ENCOUNTER — Other Ambulatory Visit: Payer: Self-pay | Admitting: Family Medicine

## 2016-03-13 NOTE — Telephone Encounter (Signed)
Called patient. He states not to worry about this - as he has transitioned care to [facility]. Please remove myself as PCP.  Thank you.

## 2016-03-13 NOTE — Telephone Encounter (Signed)
Ok to refill? Still hasn't labs that he was supposed to have in March for his last RF.

## 2016-03-14 NOTE — Telephone Encounter (Signed)
Removed you as PCP.

## 2016-06-15 IMAGING — CT CT MAXILLOFACIAL W/ CM
3 series · 15 of 47 positions shown, 18 images · IV contrast (omnipaque)
Comparison: None currently available

CLINICAL DATA: Swelling and pain around the left eye starting 3
days ago.

EXAM:
CT MAXILLOFACIAL WITH CONTRAST
TECHNIQUE: Multidetector CT imaging of the maxillofacial structures was
performed with intravenous contrast. Multiplanar CT image
reconstructions were also generated. A small metallic BB was placed
on the right temple in order to reliably differentiate right from
left.
CONTRAST:  75mL OMNIPAQUE IOHEXOL 350 MG/ML SOLN

[Series 2: max soft · axial · 0.37mm/px · z∈[-191,-15]mm · 9 of 104 slices shown, 12 images]
[im 8/104  brain]
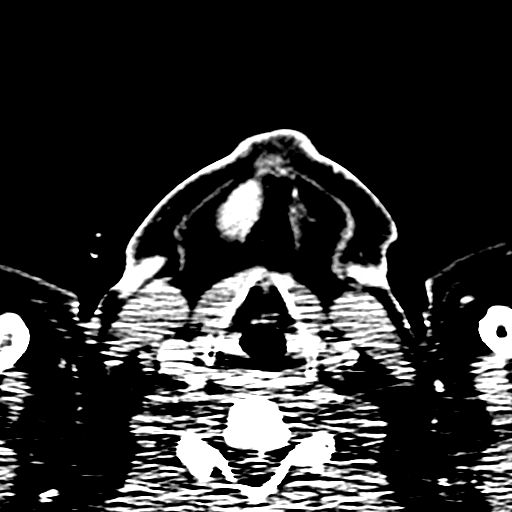
[im 8/104  bone]
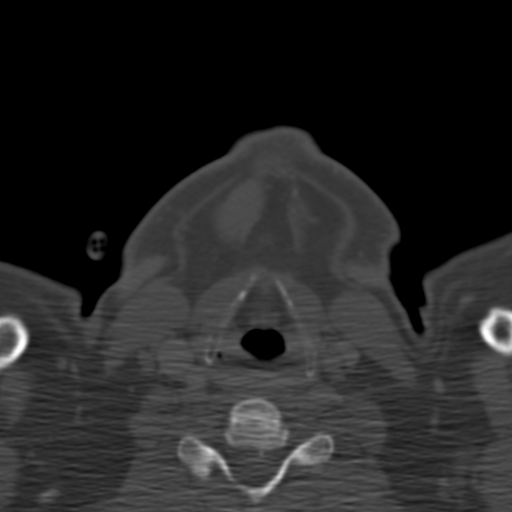
[im 18/104  bone]
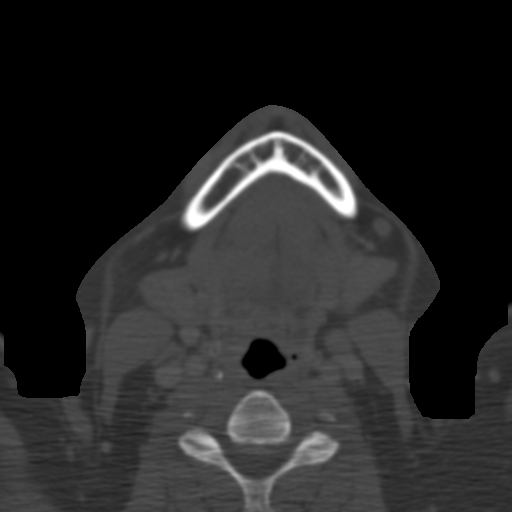
[im 29/104  bone]
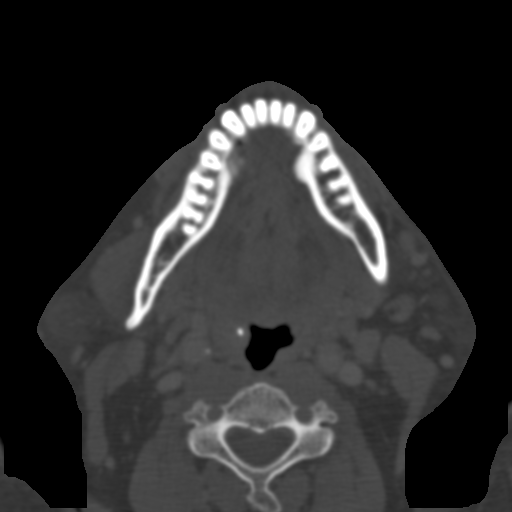
[im 40/104  bone]
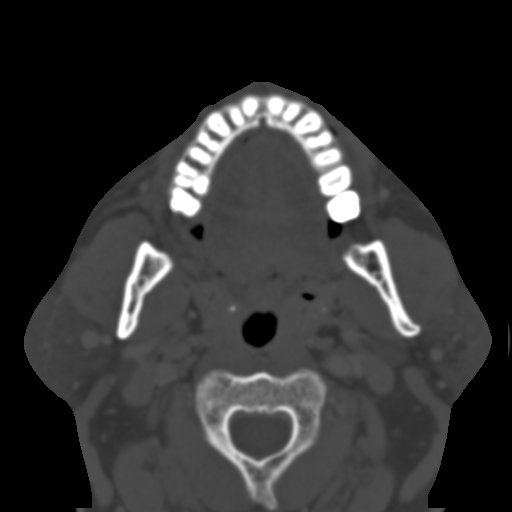
[im 54/104  brain]
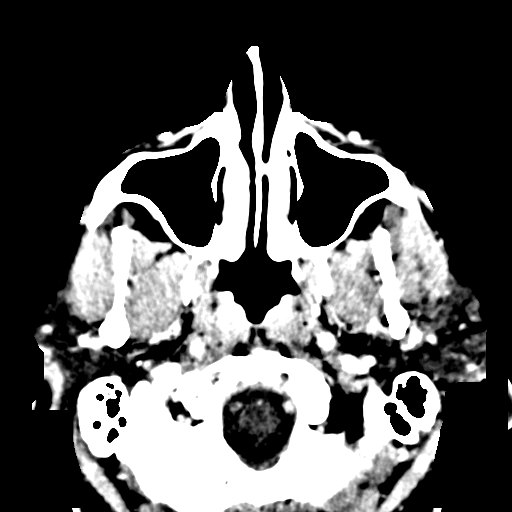
[im 54/104  bone]
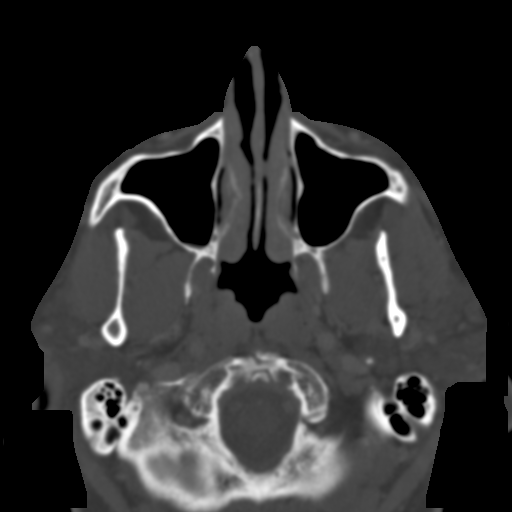
[im 64/104  bone]
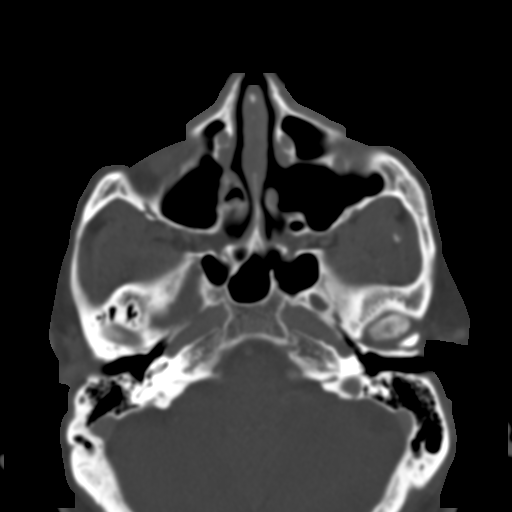
[im 75/104  bone]
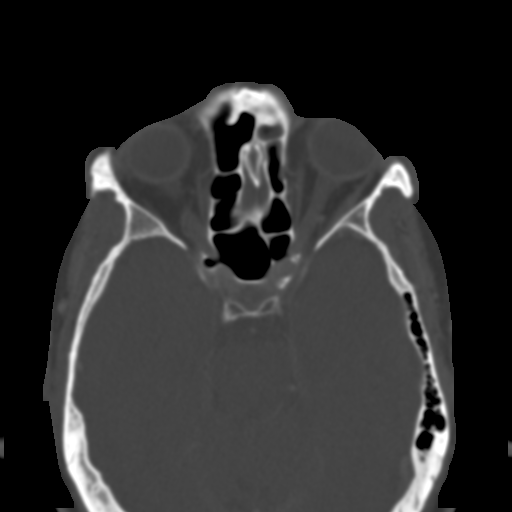
[im 86/104  bone]
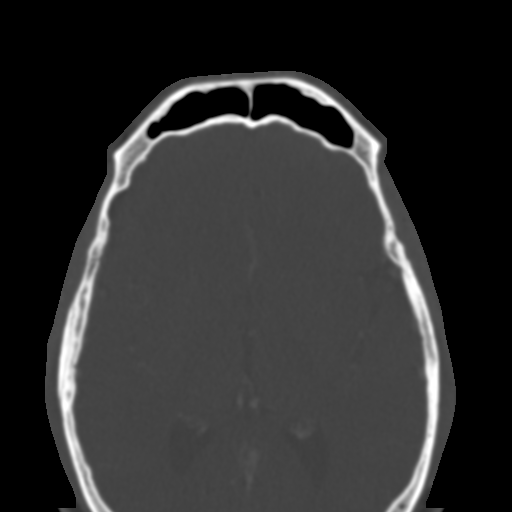
[im 96/104  brain]
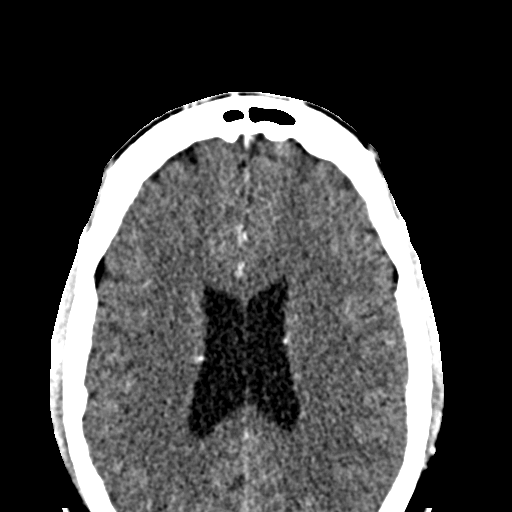
[im 96/104  bone]
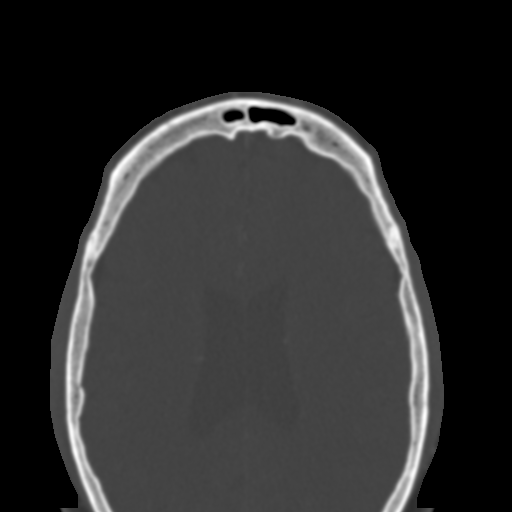

[Series 4: coronal soft · coronal · 0.37mm/px · 3 of 90 slices shown]
[im 30/90  bone]
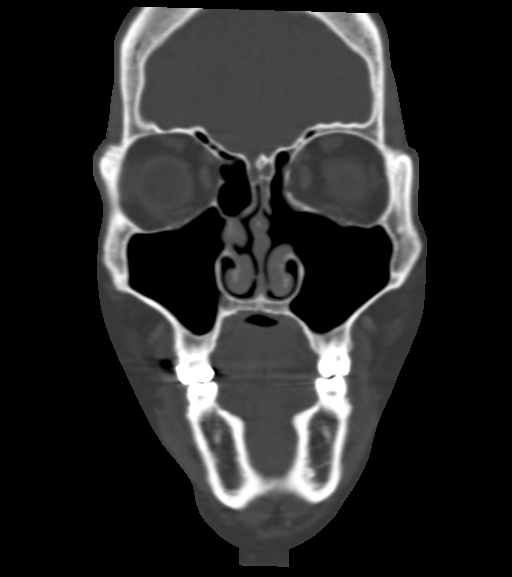
[im 40/90  bone]
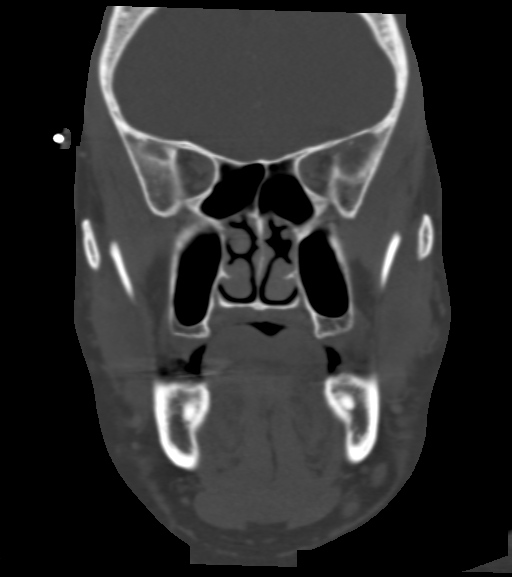
[im 50/90  bone]
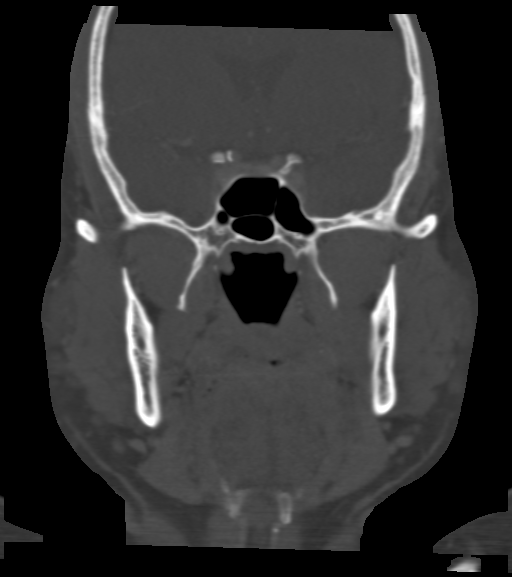

[Series 5: sagittal soft · sagittal · 0.37mm/px · 3 of 93 slices shown]
[im 31/93  bone]
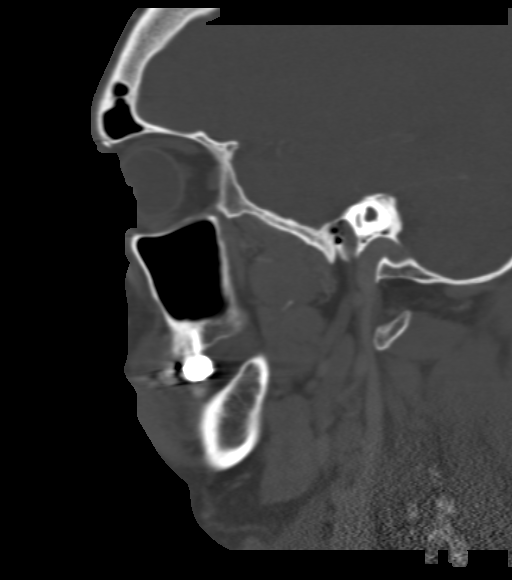
[im 47/93  bone]
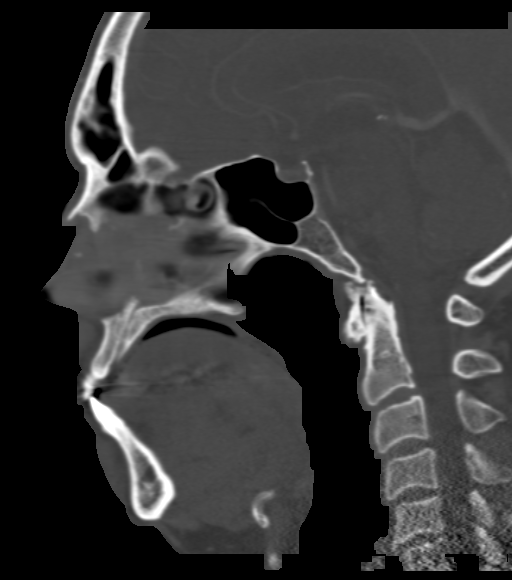
[im 62/93  bone]
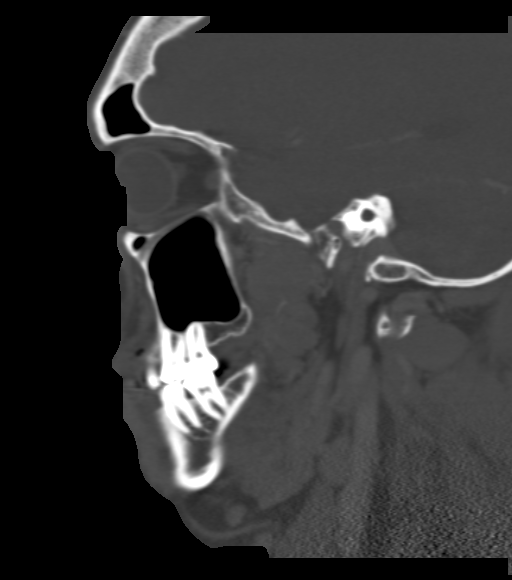

[15 of 47 positions shown; findings below may reference images not displayed]

FINDINGS: Asymmetric thickening and enhancement at the medial canthus on the
left with subtle low-density but no well-defined rim enhancing
abscess. No postseptal inflammation or proptosis. There is an 8mm
low-density structure at the level of the anterior left
ethmoid/inferior left frontal sinus which is just above the level of
the lacrimal sac. The neighboring lamina papyracea is deficient and
there is mild bulging towards the orbit. Given reports from 4444
imaging and history of left orbital abscess drainage surgery,
changes of ethmoidectomies and left maxillary antrostomy, and absent
inflammatory change along the lateral wall, this is favored to
reflect postoperative change rather than an abscess (question if
there was drain placed from the orbit to the nasal cavity). The
surgical openings of the sinuses are patent. Mild mucosal edema
present in the inferior left frontal sinus; no sinus fluid level or
complete obstruction. Negative globes and extraocular muscles.
Partial intracranial imaging is negative. Case was discussed by
telephone at the time of interpretation on 09/01/2015 at [DATE] to
Dr. MEEKA LAVERGNE , who verbally acknowledged these results.
IMPRESSION: 1. Swelling at the left medial canthus, cellulitis by history. No
postseptal inflammation.
2. 8 mm cystic structure at the postoperative left ethmoid sinus
with deficient lamina papyracea, favored postoperative given
patient's history. If no prior imaging available, suggest follow-up
to ensure this is not a growing mucocele or mucous retention cyst.
3. Endoscopic sinus surgery with patent surgical openings. Mild
inferior left frontal sinusitis without obstruction.

## 2017-12-24 ENCOUNTER — Encounter: Payer: Self-pay | Admitting: Gastroenterology

## 2024-08-22 ENCOUNTER — Ambulatory Visit (INDEPENDENT_AMBULATORY_CARE_PROVIDER_SITE_OTHER): Payer: Self-pay
